# Patient Record
Sex: Female | Born: 1970 | Race: White | Hispanic: No | Marital: Married | Smoking: Never smoker
Health system: Southern US, Community
[De-identification: ages and names within clinical notes are randomized; demographics above are authoritative.]

## PROBLEM LIST (undated history)

## (undated) DIAGNOSIS — K5792 Diverticulitis of intestine, part unspecified, without perforation or abscess without bleeding: Secondary | ICD-10-CM

## (undated) DIAGNOSIS — F419 Anxiety disorder, unspecified: Secondary | ICD-10-CM

## (undated) DIAGNOSIS — F909 Attention-deficit hyperactivity disorder, unspecified type: Secondary | ICD-10-CM

## (undated) DIAGNOSIS — F32A Depression, unspecified: Secondary | ICD-10-CM

## (undated) HISTORY — DX: Depression, unspecified: F32.A

## (undated) HISTORY — DX: Attention-deficit hyperactivity disorder, unspecified type: F90.9

## (undated) HISTORY — PX: ABDOMINAL HYSTERECTOMY: SHX81

## (undated) HISTORY — DX: Diverticulitis of intestine, part unspecified, without perforation or abscess without bleeding: K57.92

## (undated) HISTORY — PX: APPENDECTOMY: SHX54

---

## 1998-03-02 ENCOUNTER — Other Ambulatory Visit: Admission: RE | Admit: 1998-03-02 | Discharge: 1998-03-02 | Payer: Self-pay | Admitting: Obstetrics and Gynecology

## 1998-12-21 ENCOUNTER — Ambulatory Visit (HOSPITAL_COMMUNITY): Admission: RE | Admit: 1998-12-21 | Discharge: 1998-12-21 | Payer: Self-pay | Admitting: Obstetrics and Gynecology

## 1999-02-17 ENCOUNTER — Inpatient Hospital Stay (HOSPITAL_COMMUNITY): Admission: RE | Admit: 1999-02-17 | Discharge: 1999-02-19 | Payer: Self-pay | Admitting: Obstetrics and Gynecology

## 2000-12-11 ENCOUNTER — Other Ambulatory Visit: Admission: RE | Admit: 2000-12-11 | Discharge: 2000-12-11 | Payer: Self-pay | Admitting: Obstetrics and Gynecology

## 2002-07-09 ENCOUNTER — Other Ambulatory Visit: Admission: RE | Admit: 2002-07-09 | Discharge: 2002-07-09 | Payer: Self-pay | Admitting: Obstetrics and Gynecology

## 2021-01-09 ENCOUNTER — Emergency Department (HOSPITAL_COMMUNITY): Payer: Managed Care, Other (non HMO)

## 2021-01-09 ENCOUNTER — Observation Stay (HOSPITAL_COMMUNITY)
Admission: EM | Admit: 2021-01-09 | Discharge: 2021-01-10 | Disposition: A | Discharge status: Home / Self Care | Payer: Managed Care, Other (non HMO) | Attending: Internal Medicine | Admitting: Internal Medicine

## 2021-01-09 ENCOUNTER — Other Ambulatory Visit: Payer: Self-pay

## 2021-01-09 ENCOUNTER — Encounter (HOSPITAL_COMMUNITY): Payer: Self-pay

## 2021-01-09 DIAGNOSIS — F419 Anxiety disorder, unspecified: Secondary | ICD-10-CM | POA: Insufficient documentation

## 2021-01-09 DIAGNOSIS — F17200 Nicotine dependence, unspecified, uncomplicated: Secondary | ICD-10-CM | POA: Insufficient documentation

## 2021-01-09 DIAGNOSIS — G629 Polyneuropathy, unspecified: Principal | ICD-10-CM | POA: Insufficient documentation

## 2021-01-09 DIAGNOSIS — Z20822 Contact with and (suspected) exposure to covid-19: Secondary | ICD-10-CM | POA: Diagnosis not present

## 2021-01-09 DIAGNOSIS — R937 Abnormal findings on diagnostic imaging of other parts of musculoskeletal system: Secondary | ICD-10-CM

## 2021-01-09 DIAGNOSIS — Z79899 Other long term (current) drug therapy: Secondary | ICD-10-CM | POA: Insufficient documentation

## 2021-01-09 HISTORY — DX: Anxiety disorder, unspecified: F41.9

## 2021-01-09 LAB — BASIC METABOLIC PANEL
Anion gap: 10 (ref 5–15)
BUN: 11 mg/dL (ref 6–20)
CO2: 25 mmol/L (ref 22–32)
Calcium: 9.9 mg/dL (ref 8.9–10.3)
Chloride: 102 mmol/L (ref 98–111)
Creatinine, Ser: 0.71 mg/dL (ref 0.44–1.00)
GFR, Estimated: >60 mL/min (ref >60)
Glucose, Bld: 104 mg/dL — ABNORMAL HIGH (ref 70–99)
Potassium: 4 mmol/L (ref 3.5–5.1)
Sodium: 137 mmol/L (ref 135–145)

## 2021-01-09 LAB — URINALYSIS, ROUTINE W REFLEX MICROSCOPIC
Bilirubin Urine: NEGATIVE
Glucose, UA: NEGATIVE mg/dL
Hgb urine dipstick: NEGATIVE
Ketones, ur: NEGATIVE mg/dL
Leukocytes,Ua: NEGATIVE
Nitrite: NEGATIVE
Protein, ur: NEGATIVE mg/dL
Specific Gravity, Urine: 1.004 — ABNORMAL LOW (ref 1.005–1.030)
pH: 6 (ref 5.0–8.0)

## 2021-01-09 LAB — CSF CELL COUNT WITH DIFFERENTIAL
RBC Count, CSF: 0 /mm3
RBC Count, CSF: 0 /mm3
Tube #: 1
Tube #: 4
WBC, CSF: 1 /mm3 (ref 0–5)
WBC, CSF: 1 /mm3 (ref 0–5)

## 2021-01-09 LAB — CBC
HCT: 47.8 % — ABNORMAL HIGH (ref 36.0–46.0)
Hemoglobin: 15.8 g/dL — ABNORMAL HIGH (ref 12.0–15.0)
MCH: 31.5 pg (ref 26.0–34.0)
MCHC: 33.1 g/dL (ref 30.0–36.0)
MCV: 95.2 fL (ref 80.0–100.0)
Platelets: 312 10*3/uL (ref 150–400)
RBC: 5.02 MIL/uL (ref 3.87–5.11)
RDW: 12.2 % (ref 11.5–15.5)
WBC: 7.1 10*3/uL (ref 4.0–10.5)
nRBC: 0 % (ref 0.0–0.2)

## 2021-01-09 LAB — PROTEIN AND GLUCOSE, CSF
Glucose, CSF: 65 mg/dL (ref 40–70)
Total  Protein, CSF: 21 mg/dL (ref 15–45)

## 2021-01-09 LAB — TSH: TSH: 0.735 u[IU]/mL (ref 0.350–4.500)

## 2021-01-09 IMAGING — MR MR CERVICAL SPINE WO/W CM
7 of 8 series · 34 of 48 positions shown · IV contrast (5ml GADAVIST)
Comparison: None.

CLINICAL DATA: Numbness or tingling; technologist note states
bilateral foot and leg numbness

EXAM:
MRI CERVICAL AND THORACIC SPINE WITHOUT AND WITH CONTRAST
TECHNIQUE: Multiplanar and multiecho pulse sequences of the cervical spine, to
include the craniocervical junction and cervicothoracic junction,
and the thoracic spine, were obtained without and with intravenous
contrast.
CONTRAST:  5mL GADAVIST GADOBUTROL 1 MMOL/ML IV SOLN

[Series 5: T1 · sagittal · 3.0mm · 0.69mm/px · 4 of 15 slices shown (1 of 2)]
[im 1/15]
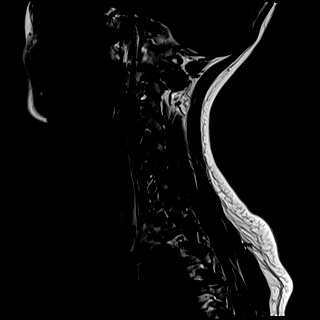
[im 5/15]
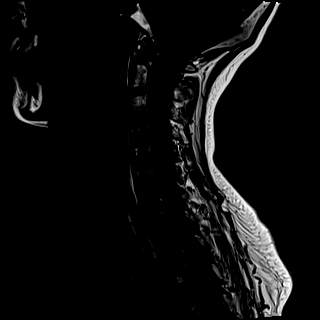
[im 10/15]
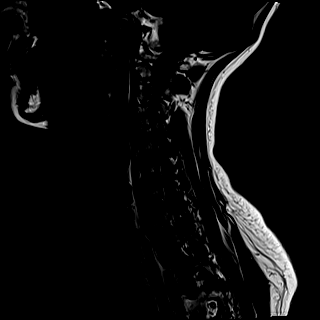
[im 15/15]
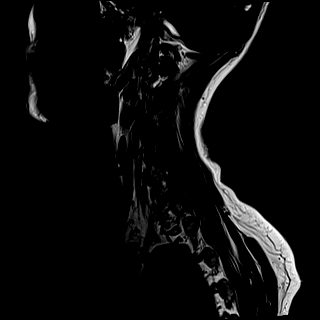

[Series 6: STIR · sagittal · 3.0mm · 0.86mm/px · 4 of 15 slices shown]
[im 1/15]
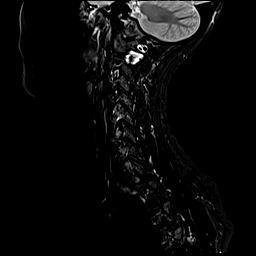
[im 5/15]
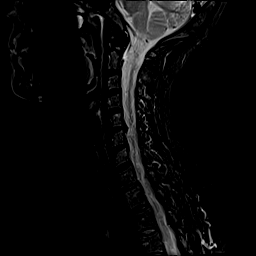
[im 10/15]
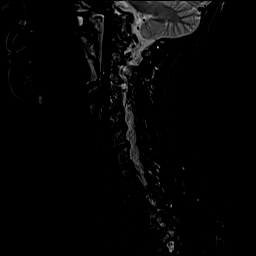
[im 15/15]
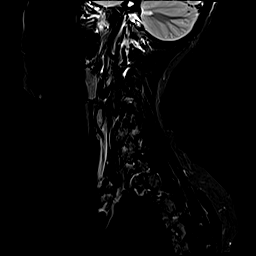

[Series 7: T2 · axial · 3.0mm · 0.78mm/px · z∈[-40,+60]mm · 8 of 30 slices shown]
[im 1/30]
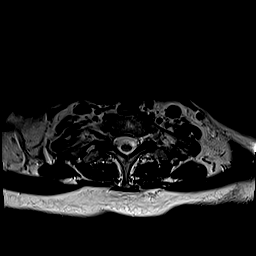
[im 5/30]
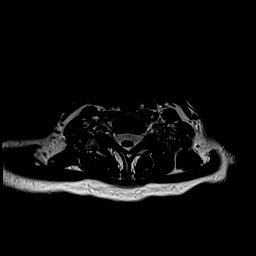
[im 9/30]
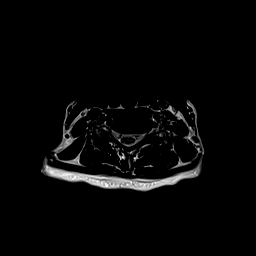
[im 13/30]
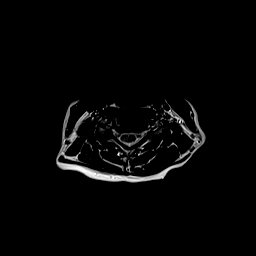
[im 17/30]
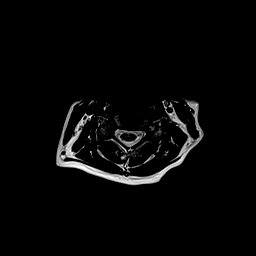
[im 21/30]
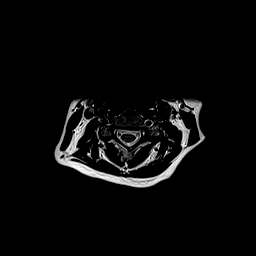
[im 25/30]
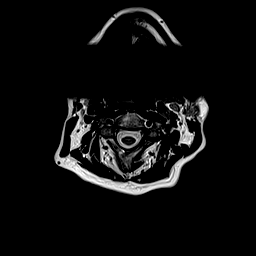
[im 30/30]
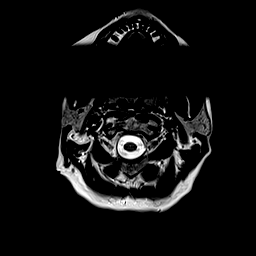

[Series 9: T1 · axial · 3.0mm · 0.35mm/px · z∈[-38,+63]mm · 8 of 30 slices shown (2 of 2)]
[im 1/30]
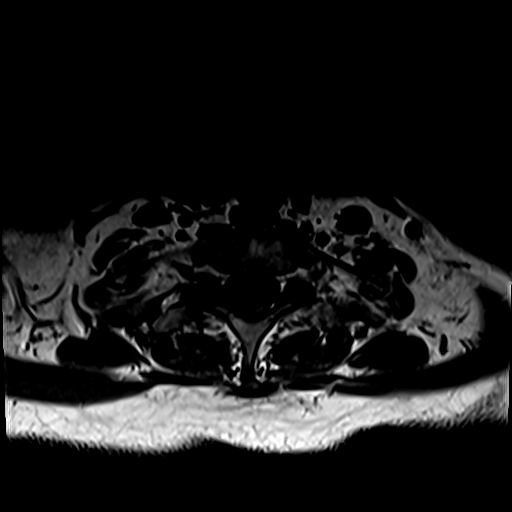
[im 5/30]
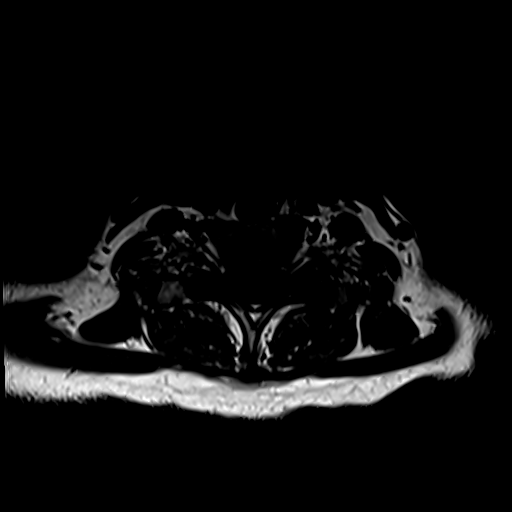
[im 9/30]
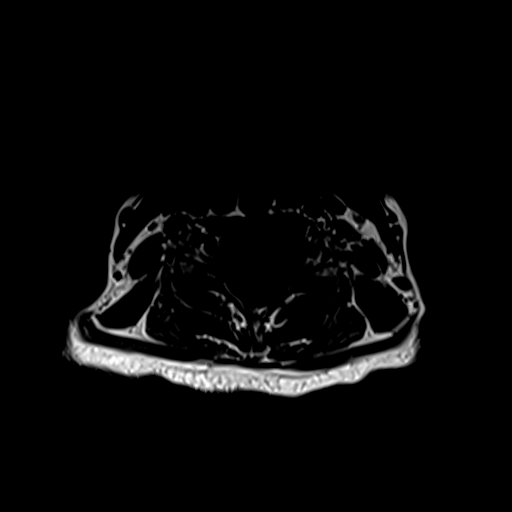
[im 13/30]
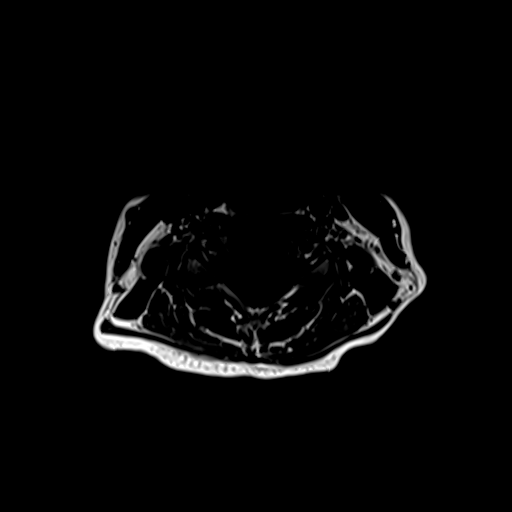
[im 17/30]
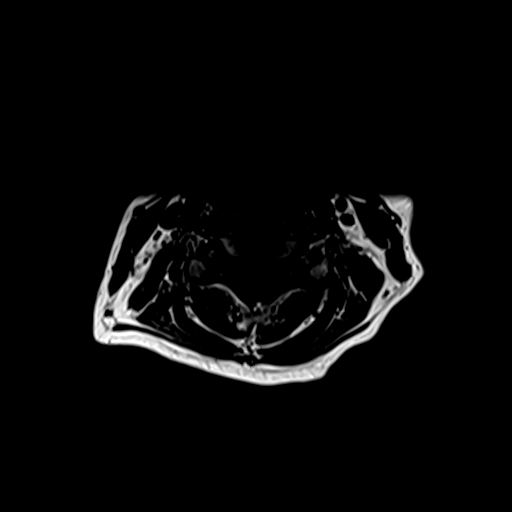
[im 21/30]
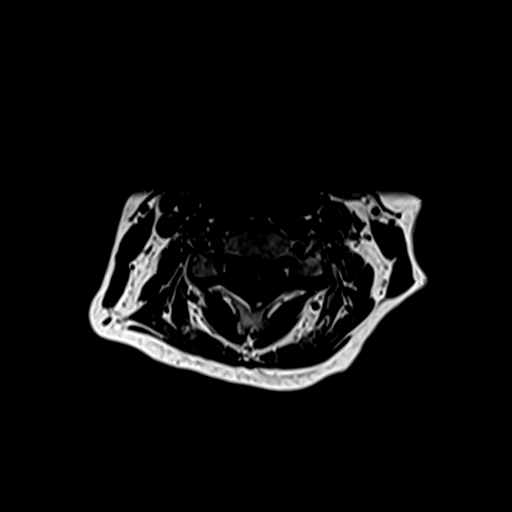
[im 25/30]
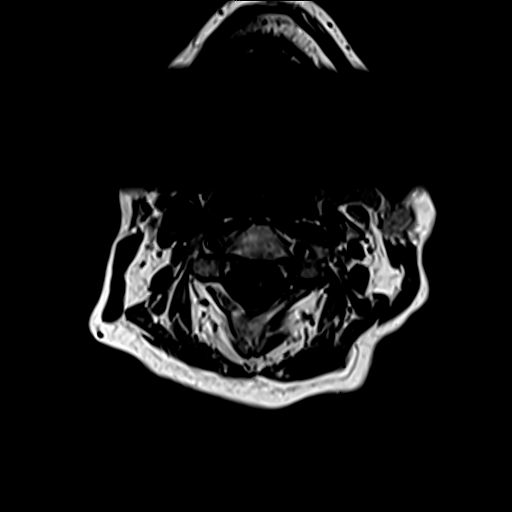
[im 30/30]
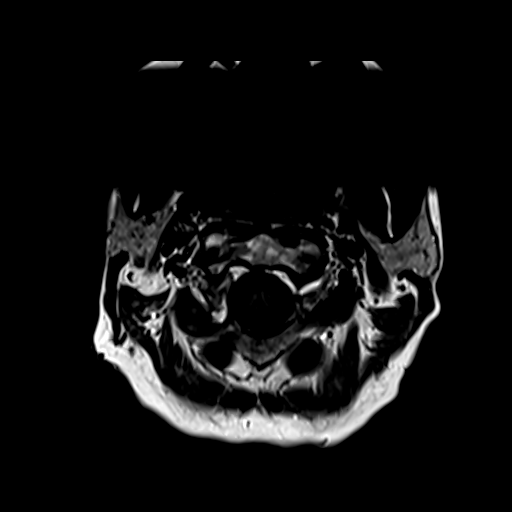

[Series 10: T2 post-contrast · sagittal · 3.0mm · 0.69mm/px · 4 of 15 slices shown]
[im 1/15]
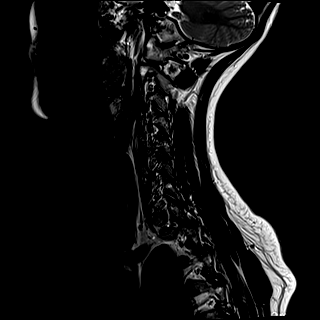
[im 5/15]
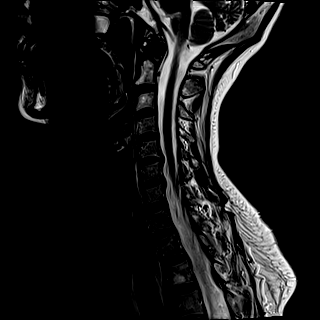
[im 10/15]
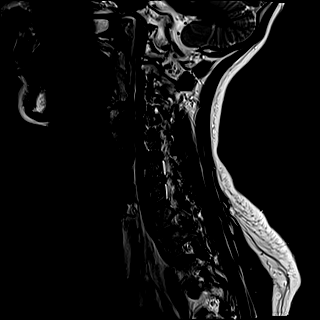
[im 15/15]
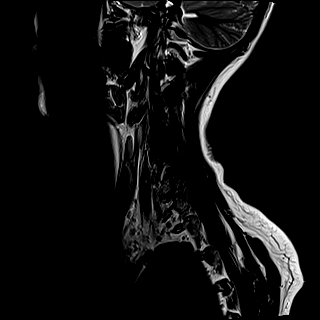

[Series 11: T1 fat-sat post-contrast · sagittal · 3.0mm · 0.69mm/px · 4 of 15 slices shown]
[im 1/15]
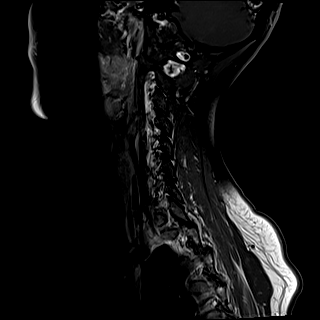
[im 5/15]
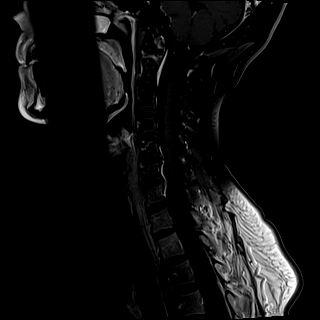
[im 10/15]
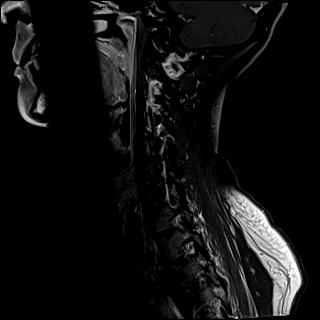
[im 15/15]
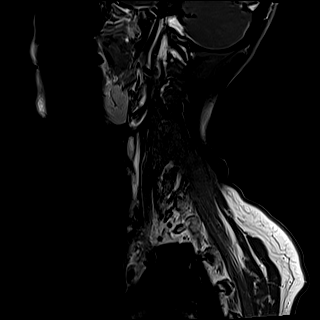

[Series 12: T1 post-contrast · axial · 3.0mm · 0.35mm/px · z∈[-38,-24]mm · 2 of 30 slices shown]
[im 1/30]
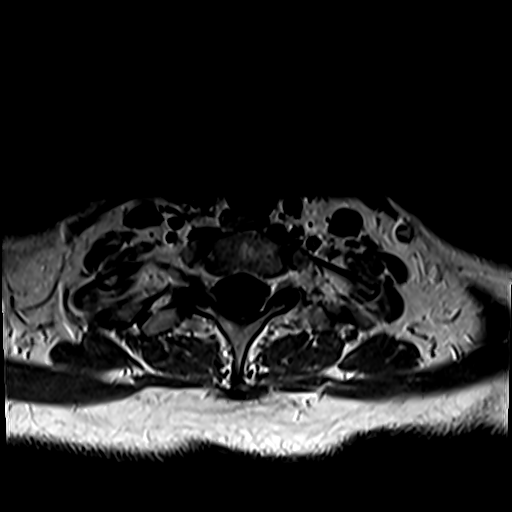
[im 5/30]
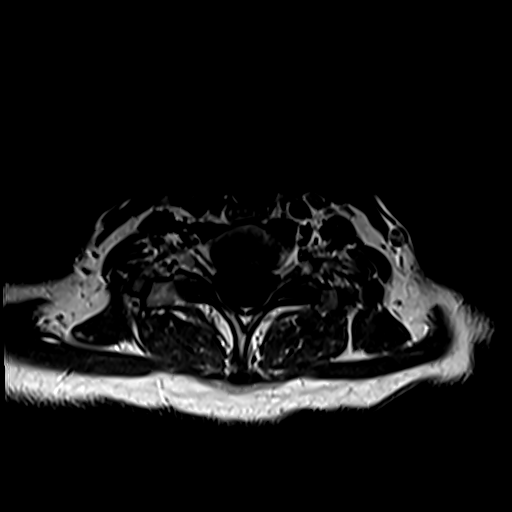

[34 of 48 positions shown; findings below may reference images not displayed]

FINDINGS: MRI CERVICAL SPINE

Alignment: Trace retrolisthesis at C5-C6.

Vertebrae: Vertebral body heights are maintained.  No marrow edema.

Cord: No abnormal signal or enhancement.

Posterior Fossa, vertebral arteries, paraspinal tissues:
Unremarkable.

Disc levels: Intervertebral disc heights and signal are maintained.
Mild multilevel facet hypertrophy. Disc bulges at C5-C6 and C6-C7.
No significant canal or foraminal stenosis.

MRI THORACIC SPINE

Alignment:  Preserved

Vertebrae: Vertebral body heights are maintained. Probable vertebral
body hemangioma at T11. No substantial marrow edema.

Cord:  Abnormal signal centrally at the T6 level.  No enhancement.

Paraspinal and other soft tissues: Unremarkable.

Disc levels: Intervertebral disc heights and signal are maintained.
There is no disc herniation or canal or foraminal stenosis at any
level.
IMPRESSION: Abnormal cord signal without enhancement at the T6 level. May
reflect chronic demyelination.

## 2021-01-09 IMAGING — MR MR THORACIC SPINE WO/W CM
8 of 9 series · 33 of 48 positions shown · IV contrast (5ml GADAVIST)
Comparison: None.

CLINICAL DATA: Numbness or tingling; technologist note states
bilateral foot and leg numbness

EXAM:
MRI CERVICAL AND THORACIC SPINE WITHOUT AND WITH CONTRAST
TECHNIQUE: Multiplanar and multiecho pulse sequences of the cervical spine, to
include the craniocervical junction and cervicothoracic junction,
and the thoracic spine, were obtained without and with intravenous
contrast.
CONTRAST:  5mL GADAVIST GADOBUTROL 1 MMOL/ML IV SOLN

[Series 16: T1 · sagittal · 4.0mm · 1.72mm/px · 1 of 5 slices shown (1 of 3)]
[im 1/5]
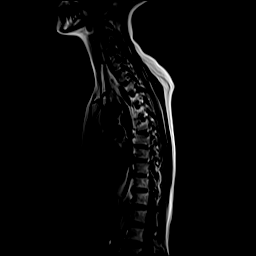

[Series 17: STIR · sagittal · 3.0mm · 1.06mm/px · 3 of 17 slices shown]
[im 1/17]
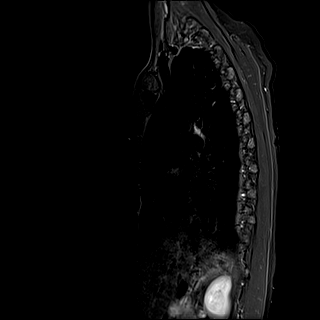
[im 9/17]
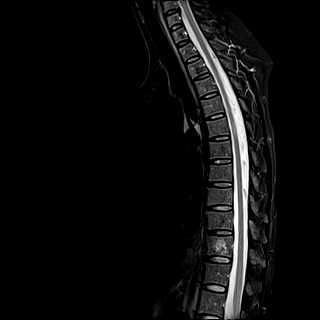
[im 17/17]
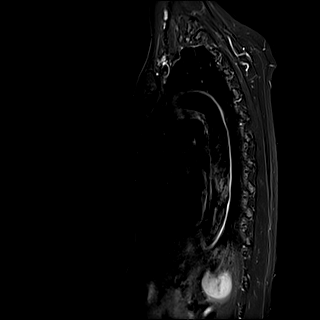

[Series 18: T1 · sagittal · 3.0mm · 1.00mm/px · 4 of 17 slices shown (2 of 3)]
[im 1/17]
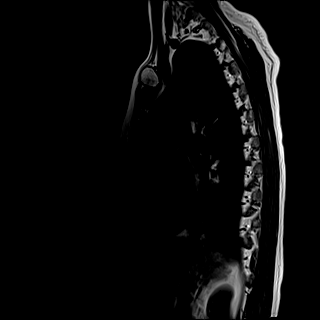
[im 6/17]
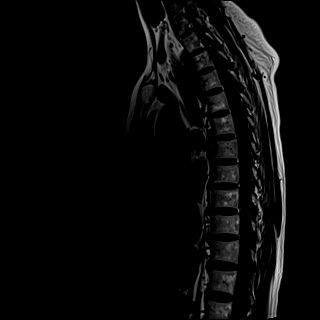
[im 11/17]
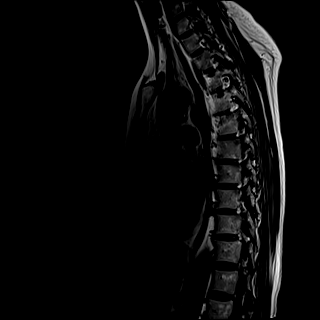
[im 17/17]
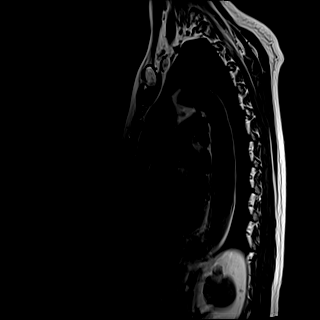

[Series 19: T2 · axial · 4.0mm · 0.78mm/px · z∈[-279,-40]mm · 8 of 39 slices shown]
[im 1/39]
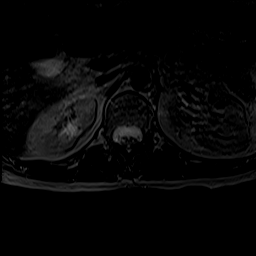
[im 6/39]
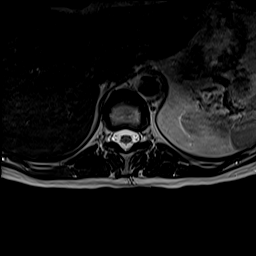
[im 11/39]
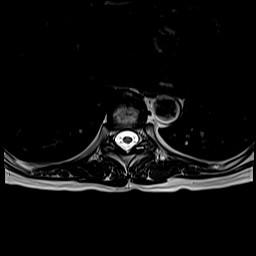
[im 17/39]
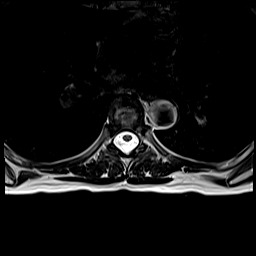
[im 22/39]
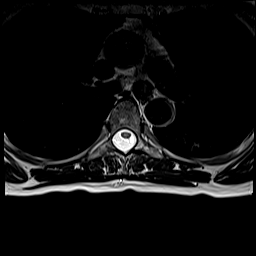
[im 28/39]
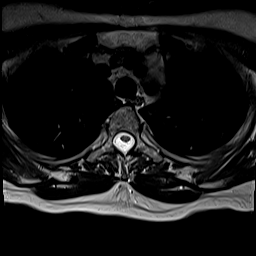
[im 33/39]
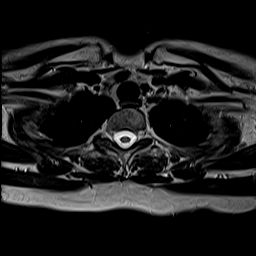
[im 39/39]
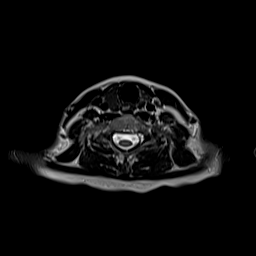

[Series 21: T1 · axial · 4.0mm · 0.39mm/px · z∈[-279,-40]mm · 8 of 39 slices shown (3 of 3)]
[im 1/39]
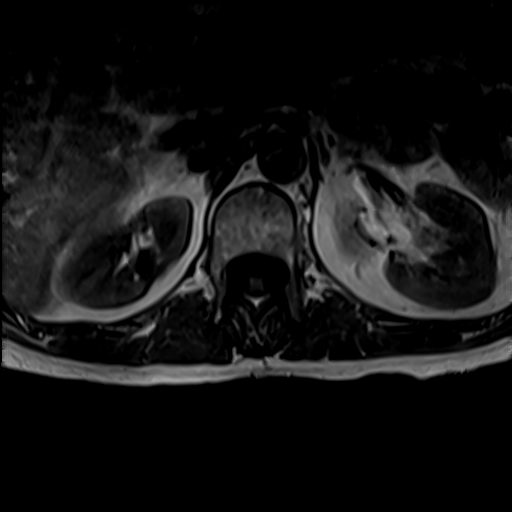
[im 6/39]
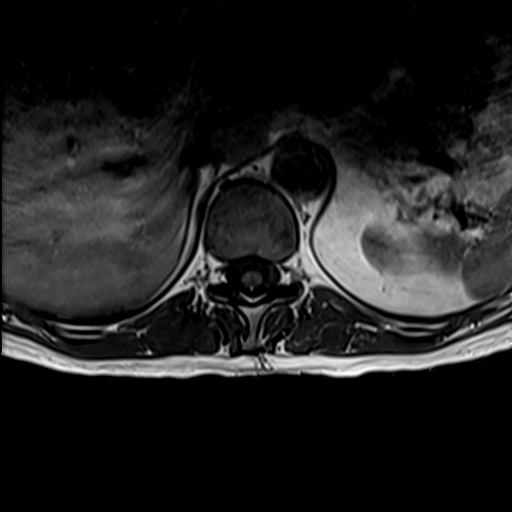
[im 11/39]
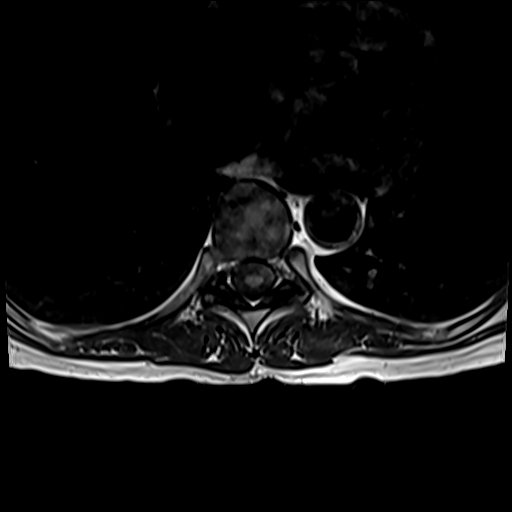
[im 17/39]
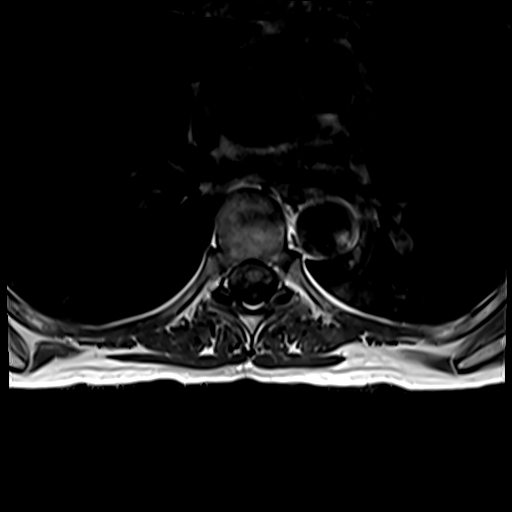
[im 22/39]
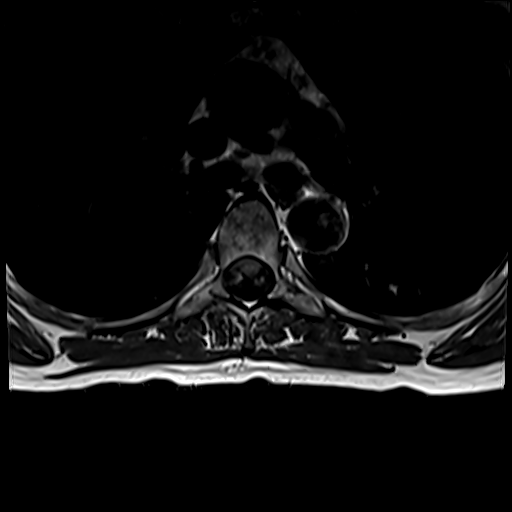
[im 28/39]
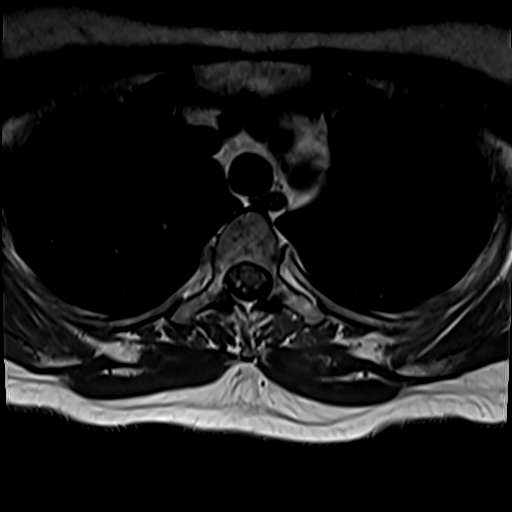
[im 33/39]
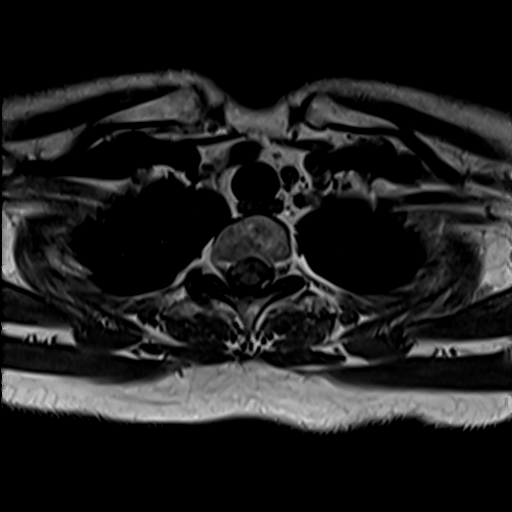
[im 39/39]
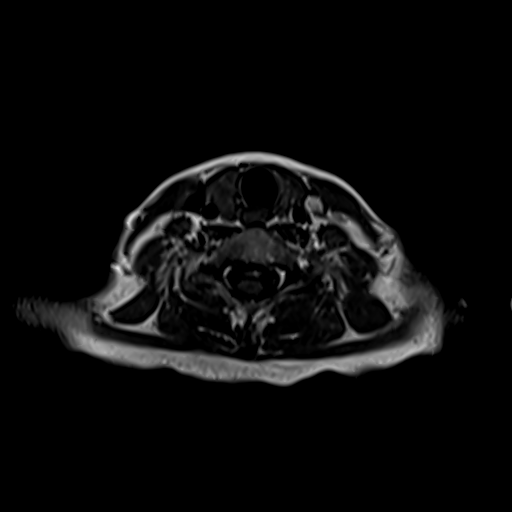

[Series 22: T2 post-contrast · sagittal · 3.0mm · 0.83mm/px · 4 of 17 slices shown]
[im 1/17]
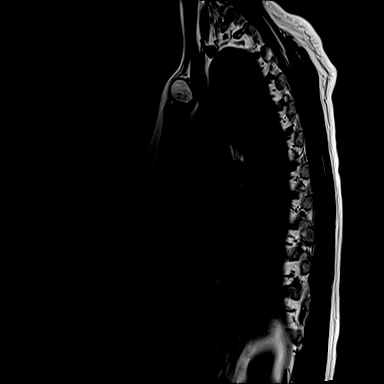
[im 6/17]
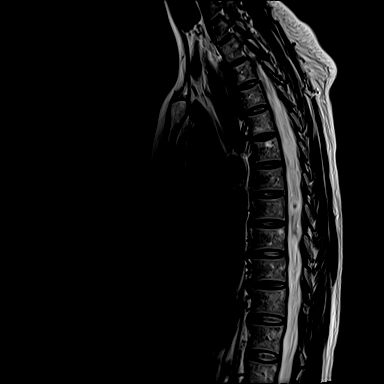
[im 11/17]
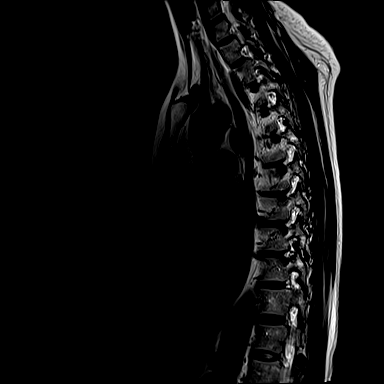
[im 17/17]
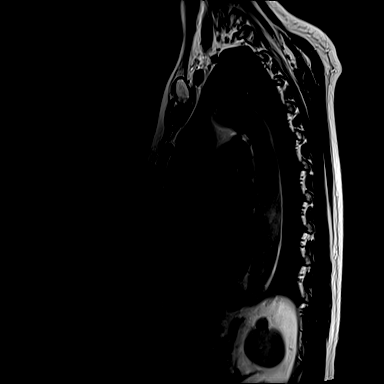

[Series 23: T1 fat-sat post-contrast · sagittal · 3.0mm · 1.00mm/px · 4 of 17 slices shown]
[im 1/17]
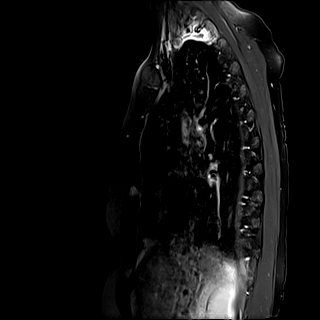
[im 6/17]
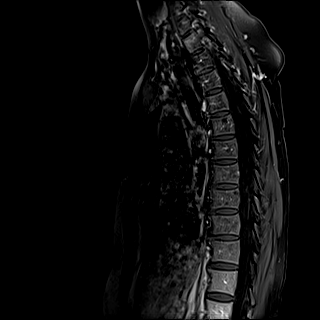
[im 11/17]
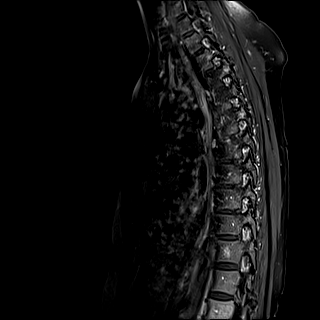
[im 17/17]
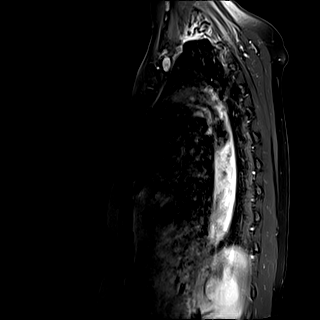

[Series 24: T1 post-contrast · axial · 4.0mm · 0.41mm/px · 1 of 39 slices shown]
[im 1/39]
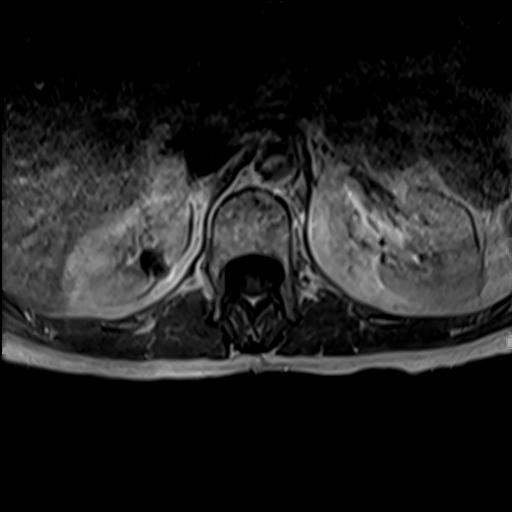

[33 of 48 positions shown; findings below may reference images not displayed]

FINDINGS: MRI CERVICAL SPINE

Alignment: Trace retrolisthesis at C5-C6.

Vertebrae: Vertebral body heights are maintained.  No marrow edema.

Cord: No abnormal signal or enhancement.

Posterior Fossa, vertebral arteries, paraspinal tissues:
Unremarkable.

Disc levels: Intervertebral disc heights and signal are maintained.
Mild multilevel facet hypertrophy. Disc bulges at C5-C6 and C6-C7.
No significant canal or foraminal stenosis.

MRI THORACIC SPINE

Alignment:  Preserved

Vertebrae: Vertebral body heights are maintained. Probable vertebral
body hemangioma at T11. No substantial marrow edema.

Cord:  Abnormal signal centrally at the T6 level.  No enhancement.

Paraspinal and other soft tissues: Unremarkable.

Disc levels: Intervertebral disc heights and signal are maintained.
There is no disc herniation or canal or foraminal stenosis at any
level.
IMPRESSION: Abnormal cord signal without enhancement at the T6 level. May
reflect chronic demyelination.

## 2021-01-09 MED ORDER — ACETAMINOPHEN 325 MG PO TABS: 650.0000 mg | ORAL_TABLET | Freq: Four times a day (QID) | ORAL | Status: DC | PRN

## 2021-01-09 MED ORDER — ACETAMINOPHEN 325 MG PO TABS
650.0000 mg | ORAL_TABLET | Freq: Four times a day (QID) | ORAL | Status: DC | PRN
  Administered 2021-01-10 (×2): 650 mg via ORAL
  Filled 2021-01-09 (×2): qty 2

## 2021-01-09 MED ORDER — GADOBUTROL 1 MMOL/ML IV SOLN
5.0000 mL | Freq: Once | INTRAVENOUS | Status: AC | PRN
  Administered 2021-01-09: 5 mL via INTRAVENOUS

## 2021-01-09 MED ORDER — ACETAMINOPHEN 650 MG RE SUPP: 650.0000 mg | Freq: Four times a day (QID) | RECTAL | Status: DC | PRN

## 2021-01-09 MED ORDER — ZOLPIDEM TARTRATE 5 MG PO TABS
5.0000 mg | ORAL_TABLET | Freq: Once | ORAL | Status: AC
  Administered 2021-01-09: 5 mg via ORAL
  Filled 2021-01-09: qty 1

## 2021-01-09 MED ORDER — NICOTINE 14 MG/24HR TD PT24
14.0000 mg | MEDICATED_PATCH | Freq: Every day | TRANSDERMAL | Status: DC
  Filled 2021-01-09: qty 1

## 2021-01-09 MED ORDER — SODIUM CHLORIDE 0.9 % IV SOLN: INTRAVENOUS | Status: DC

## 2021-01-09 MED ORDER — ALPRAZOLAM 0.5 MG PO TABS
1.0000 mg | ORAL_TABLET | Freq: Two times a day (BID) | ORAL | Status: DC | PRN
  Administered 2021-01-10: 1 mg via ORAL
  Filled 2021-01-09: qty 2

## 2021-01-09 MED ORDER — ADULT MULTIVITAMIN W/MINERALS CH
1.0000 | ORAL_TABLET | Freq: Every day | ORAL | Status: DC
  Administered 2021-01-10: 1 via ORAL
  Filled 2021-01-09: qty 1

## 2021-01-09 NOTE — ED Provider Notes (Addendum)
Physical Exam  BP 125/84   Pulse 66   Temp 98.1 F (36.7 C)   Resp 19   Ht 5\' 6"  (1.676 m)   Wt 58.1 kg   SpO2 96%   BMI 20.66 kg/m   Physical Exam  ED Course/Procedures   Clinical Course as of 01/09/21 2109  Sat Jan 09, 2021  1457 Urine is negative.  [CS]  1505 Care of the patient signed out to Dr. Jan 11, 2021 at the change of shift pending MRI.  [CS]    Clinical Course User Index [CS] Rhunette Croft, MD    .Lumbar Puncture  Date/Time: 01/09/2021 9:08 PM Performed by: 01/11/2021, MD Authorized by: Derwood Kaplan, MD   Consent:    Consent obtained:  Written   Consent given by:  Patient   Risks, benefits, and alternatives were discussed: yes     Risks discussed:  Bleeding, infection, pain, nerve damage and headache   Alternatives discussed:  No treatment Universal protocol:    Procedure explained and questions answered to patient or proxy's satisfaction: yes     Relevant documents present and verified: yes     Test results available: yes     Imaging studies available: yes     Required blood products, implants, devices, and special equipment available: yes     Immediately prior to procedure a time out was called: yes     Site/side marked: yes     Patient identity confirmed:  Arm band Pre-procedure details:    Procedure purpose:  Diagnostic   Preparation: Patient was prepped and draped in usual sterile fashion   Anesthesia:    Anesthesia method:  Local infiltration   Local anesthetic:  Lidocaine 1% w/o epi Procedure details:    Lumbar space:  L3-L4 interspace   Patient position:  Sitting   Needle gauge:  18   Needle type:  Diamond point   Ultrasound guidance: no     Number of attempts:  1   Fluid appearance:  Clear   Tubes of fluid:  4   Total volume (ml):  15 Post-procedure details:    Puncture site:  Adhesive bandage applied and direct pressure applied   Procedure completion:  Tolerated well, no immediate complications .Critical Care Performed  by: Derwood Kaplan, MD Authorized by: Derwood Kaplan, MD   Critical care provider statement:    Critical care time (minutes):  32   Critical care was necessary to treat or prevent imminent or life-threatening deterioration of the following conditions:  CNS failure or compromise   Critical care was time spent personally by me on the following activities:  Discussions with consultants, evaluation of patient's response to treatment, examination of patient, ordering and performing treatments and interventions, ordering and review of laboratory studies, ordering and review of radiographic studies, pulse oximetry, re-evaluation of patient's condition, obtaining history from patient or surrogate and review of old charts    MDM  Assuming care of patient from Dr. Derwood Kaplan.   Patient in the ED for paresthesias in bilateral lower extremity for the last 3 weeks/month. Workup thus far shows reassuring looking labs.  Concerning findings are as following : None Important pending results are MRI of the lumbar and thoracic spine with and without contrast.  According to Dr. Bernette Mayers, plan is to review the MRI.  If there is any evidence of demyelination, consult neurology.  Patient had no complains, no concerns from the nursing side. Will continue to monitor.   7:10 PM  Discussed case with Dr.  Lindzen.  He has recommended that we get MRI without contrast of the brain and lumbar spine. He will request Dr. Wilford Corner to follow up. Patient made aware.   9:10 PM Dr. Wilford Corner requested LP. It has been completed. Pt will be [facility] to [facility] as the MRI will not be completed until tomorrow.   Derwood Kaplan, MD 01/09/21 Ronna Polio, MD 01/09/21 2111

## 2021-01-09 NOTE — H&P (Signed)
History and Physical    Jennifer Bullock ZGY:[id number] DOB: 15-Jul-1971 DOA: 01/09/2021  PCP: Leanora Ivanoff., MD   Patient coming from:  Home  Chief Complaint: Numbness in feet and legs  HPI: Jennifer Bullock is a 50 y.o. female with medical history of anxiety and perimenopausal who presents for evaluation of decreased sensation in feet and legs. Jennifer Bullock reports she began having tingling in her toes about a month ago. She reports that since then the tingling has been progressively getting worse and has crept up her legs. This morning it was all the way up to her knees. She denies any weakness. No fever or back pain. No tingling in fingers. No facial droop, slurred speech or difficulty walking. The tingling and numbness is in both feet and legs. She was in [place] earlier this month and had a course of doxycycline while she was there 'for her stomach' which she attributed to food poisoning while she was there. The symptoms started prior to her going on her trip. She eats a well balanced diet, not vegan or vegetarian. She has tried Yoga without any change in her symptoms. She had her Covid vaccines in March/April 2021 with booster in December 2021. No sick contacts. No new pets or plants in home. She does smoke cigarettes but has been cutting back and using vape products.  She does report she has tried marijuana for symptoms a few weeks ago.  She has not used any marijuana in the last 7 to 10 days.  ED Course: Patient had CT of her neck and thoracic spine.  She had a nonenhancing lesion at the T6 level suspicious for a chronic demyelination.  Patient is hemodynamically stable and lab work is otherwise unremarkable.  Review of Systems:  General: Denies fever, chills, weight loss, night sweats.  Denies dizziness.  Denies change in appetite HENT: Denies head trauma, headache, denies change in hearing, tinnitus.  Denies nasal congestion or bleeding.  Denies sore throat, sores in mouth.  Denies  difficulty swallowing Eyes: Denies blurry vision, pain in eye, drainage.  Denies discoloration of eyes. Neck: Denies pain.  Denies swelling.  Denies pain with movement. Cardiovascular: Denies chest pain, palpitations.  Denies edema.  Denies orthopnea Respiratory: Denies shortness of breath, cough.  Denies wheezing.  Denies sputum production Gastrointestinal: Denies abdominal pain, swelling.  Denies nausea, vomiting, diarrhea.  Denies melena.  Denies hematemesis. Musculoskeletal: Denies limitation of movement.  Denies deformity or swelling.  Denies pain.  Denies arthralgias or myalgias. Genitourinary: Denies pelvic pain.  Denies urinary frequency or hesitancy.  Denies dysuria.  Skin: Denies rash.  Denies petechiae, purpura, ecchymosis. Neurological: Reports paresthesia of feet and legs. Denies headache.  Denies syncope.  Denies seizure activity.  Denies weakness. Denies slurred speech, drooping face.  Denies visual change. Psychiatric: Denies depression, anxiety.  Denies suicidal thoughts or ideation.  Denies hallucinations.  Past Medical History:  Diagnosis Date  . Anxiety     Past Surgical History:  Procedure Laterality Date  . ABDOMINAL HYSTERECTOMY    . APPENDECTOMY      Social History  reports that she has never smoked. She has never used smokeless tobacco. No history on file for alcohol use and drug use.  Allergies  Allergen Reactions  . Nsaids Nausea And Vomiting    History reviewed. No pertinent family history.   Prior to Admission medications   Medication Sig Start Date End Date Taking? Authorizing Provider  acetaminophen (TYLENOL) 325 MG tablet Take 650 mg by  mouth every 6 (six) hours as needed for mild pain, fever or headache.   Yes [provider]  ALPRAZolam Prudy Feeler) 1 MG tablet Take 1 mg by mouth 2 (two) times daily as needed for anxiety. 12/30/20  Yes [provider]  amphetamine-dextroamphetamine (ADDERALL) 20 MG tablet Take 20 mg by mouth 2 (two)  times daily. 12/29/20  Yes [provider]  estradiol (ESTRACE) 1 MG tablet Take 0.5-1 mg by mouth daily. 11/10/20  Yes [provider]  Multiple Vitamin (MULTIVITAMIN WITH MINERALS) TABS tablet Take 1 tablet by mouth daily.   Yes [provider]    Physical Exam: Vitals:   01/09/21 1646 01/09/21 1730 01/09/21 1830 01/09/21 1958  BP: 103/77 106/83 114/82 125/84  Pulse: 70 100 77 66  Resp: 18 18 18 19   Temp:      SpO2: 96% 99% 93% 96%  Weight:      Height:        Constitutional: NAD, calm, comfortable Vitals:   01/09/21 1646 01/09/21 1730 01/09/21 1830 01/09/21 1958  BP: 103/77 106/83 114/82 125/84  Pulse: 70 100 77 66  Resp: 18 18 18 19   Temp:      SpO2: 96% 99% 93% 96%  Weight:      Height:       General: WDWN, Alert and oriented x3.  Eyes: EOMI, PERRL, conjunctivae normal.  Sclera nonicteric HENT:  [place]/AT, external ears normal.  Nares patent without epistasis.  Mucous membranes are moist. Posterior pharynx clear of any exudate or lesions. Normal dentition.  Neck: Soft, normal range of motion, supple, no masses, no thyromegaly. Trachea midline Respiratory: clear to auscultation bilaterally, no wheezing, no crackles. Normal respiratory effort. No accessory muscle use.  Cardiovascular: Regular rate and rhythm, no murmurs / rubs / gallops. No extremity edema. 2+ pedal pulses.  Abdomen: Soft, no tenderness, nondistended, no rebound or guarding.  No masses palpated. No hepatosplenomegaly. Bowel sounds normoactive Musculoskeletal: FROM. no cyanosis. No joint deformity upper and lower extremities. no contractures. Normal muscle tone.  Skin: Warm, dry, intact no rashes, lesions, ulcers. No induration Neurologic: CN 2-12 grossly intact. Normal speech. Sensation intact in trunk and upper extremities. Decreased sensation to touch in bilateral legs from feet to knee, patella DTR +2 bilaterally. Strength 5/5 in all extremities.   Psychiatric: Normal judgment and  insight.  Normal mood.    Labs on Admission: I have personally reviewed following labs and imaging studies  CBC: Recent Labs  Lab 01/09/21 1137  WBC 7.1  HGB 15.8*  HCT 47.8*  MCV 95.2  PLT 312    Basic Metabolic Panel: Recent Labs  Lab 01/09/21 1137  NA 137  K 4.0  CL 102  CO2 25  GLUCOSE 104*  BUN 11  CREATININE 0.71  CALCIUM 9.9    GFR: Estimated Creatinine Clearance: 78.9 mL/min (by C-G formula based on SCr of 0.71 mg/dL).  Liver Function Tests: No results for input(s): AST, ALT, ALKPHOS, BILITOT, PROT, ALBUMIN in the last 168 hours.  Urine analysis:    Component Value Date/Time   COLORURINE YELLOW 01/09/2021 1422   APPEARANCEUR CLEAR 01/09/2021 1422   LABSPEC 1.004 (L) 01/09/2021 1422   PHURINE 6.0 01/09/2021 1422   GLUCOSEU NEGATIVE 01/09/2021 1422   HGBUR NEGATIVE 01/09/2021 1422   BILIRUBINUR NEGATIVE 01/09/2021 1422   KETONESUR NEGATIVE 01/09/2021 1422   PROTEINUR NEGATIVE 01/09/2021 1422   NITRITE NEGATIVE 01/09/2021 1422   LEUKOCYTESUR NEGATIVE 01/09/2021 1422    Radiological Exams on Admission: MR Cervical  Spine W or Wo Contrast  Result Date: 01/09/2021 CLINICAL DATA:  Numbness or tingling; technologist note states bilateral foot and leg numbness EXAM: MRI CERVICAL AND THORACIC SPINE WITHOUT AND WITH CONTRAST TECHNIQUE: Multiplanar and multiecho pulse sequences of the cervical spine, to include the craniocervical junction and cervicothoracic junction, and the thoracic spine, were obtained without and with intravenous contrast. CONTRAST:  5mL GADAVIST GADOBUTROL 1 MMOL/ML IV SOLN COMPARISON:  None. FINDINGS: MRI CERVICAL SPINE Alignment: Trace retrolisthesis at C5-C6. Vertebrae: Vertebral body heights are maintained.  No marrow edema. Cord: No abnormal signal or enhancement. Posterior Fossa, vertebral arteries, paraspinal tissues: Unremarkable. Disc levels: Intervertebral disc heights and signal are maintained. Mild multilevel facet hypertrophy. Disc  bulges at C5-C6 and C6-C7. No significant canal or foraminal stenosis. MRI THORACIC SPINE Alignment:  Preserved Vertebrae: Vertebral body heights are maintained. Probable vertebral body hemangioma at T11. No substantial marrow edema. Cord:  Abnormal signal centrally at the T6 level.  No enhancement. Paraspinal and other soft tissues: Unremarkable. Disc levels: Intervertebral disc heights and signal are maintained. There is no disc herniation or canal or foraminal stenosis at any level. IMPRESSION: Abnormal cord signal without enhancement at the T6 level. May reflect chronic demyelination. Electronically Signed   By: Guadlupe SpanishPraneil  Patel M.D.   On: 01/09/2021 17:08   MR THORACIC SPINE W WO CONTRAST  Result Date: 01/09/2021 CLINICAL DATA:  Numbness or tingling; technologist note states bilateral foot and leg numbness EXAM: MRI CERVICAL AND THORACIC SPINE WITHOUT AND WITH CONTRAST TECHNIQUE: Multiplanar and multiecho pulse sequences of the cervical spine, to include the craniocervical junction and cervicothoracic junction, and the thoracic spine, were obtained without and with intravenous contrast. CONTRAST:  5mL GADAVIST GADOBUTROL 1 MMOL/ML IV SOLN COMPARISON:  None. FINDINGS: MRI CERVICAL SPINE Alignment: Trace retrolisthesis at C5-C6. Vertebrae: Vertebral body heights are maintained.  No marrow edema. Cord: No abnormal signal or enhancement. Posterior Fossa, vertebral arteries, paraspinal tissues: Unremarkable. Disc levels: Intervertebral disc heights and signal are maintained. Mild multilevel facet hypertrophy. Disc bulges at C5-C6 and C6-C7. No significant canal or foraminal stenosis. MRI THORACIC SPINE Alignment:  Preserved Vertebrae: Vertebral body heights are maintained. Probable vertebral body hemangioma at T11. No substantial marrow edema. Cord:  Abnormal signal centrally at the T6 level.  No enhancement. Paraspinal and other soft tissues: Unremarkable. Disc levels: Intervertebral disc heights and signal are  maintained. There is no disc herniation or canal or foraminal stenosis at any level. IMPRESSION: Abnormal cord signal without enhancement at the T6 level. May reflect chronic demyelination. Electronically Signed   By: Guadlupe SpanishPraneil  Patel M.D.   On: 01/09/2021 17:08    Assessment/Plan Principal Problem:   Neuropathy Ms. Macdowell [facility] Med[facility]Surg floor.  She has numbness in bilateral legs up to the knees.  She has a nonenhancing abnormal cord signal at T6 on CT of her  Spine chronic demyelination.  Differential diagnosis includes multiple sclerosis, viral infection, or myelitis, ischemia.  Case was discussed with neurology by the ER physician.  Neurology recommends obtaining an MRI of the brain and lumbar spine and performing a lumbar puncture.  Neurology requested patient be [facility] where they can evaluate and see patient tonight upon arrival.  Active Problems:   Abnormal computed tomography of thoracic spine See discussion above    Anxiety Chronic.  Continue home medication of Ativan    Tobacco use Nicotine patch provided to prevent withdrawal.     DVT prophylaxis: Padua score low. TED  hose and ambulation for DVT prophylaxis  Code Status:   Full Code  Family Communication:  Diagnosis and plan discussed with patient and her husband who is at the bedside.  Questions were answered.  They verbalized understanding and agree with plan.  Further recommendations to follow as clinically indicated Disposition Plan:   Patient is from:  Home  Anticipated DC to:  Home  Anticipated DC date:  Anticipate more than 2 midnight stay in the hospital  Anticipated DC barriers: No barriers to discharge identified at this time  Consults called:  Neurology-ER physician discussed with Neurology who will see pt upon arrival to [facility]. Admission status:  Inpatient   Claudean Severance Delesia Martinek MD [facility]  How to contact the [facility] Attending or Consulting provider  7A - 7P or covering provider during after hours 7P -7A, for this patient?   1. Check the care team in [facility] and look for a) attending/consulting [facility] provider listed and b) the [facility] team listed 2. Log into [email or web address] and use [facility] universal password to access. If you do not have the password, please contact the hospital operator. 3. Locate the [facility] provider you are looking for under [facility] and page to a number that you can be directly reached. 4. If you still have difficulty reaching the provider, please page the [facility] (Director on Call) for the Hospitalists listed on amion for assistance.  01/09/2021, 8:24 PM

## 2021-01-09 NOTE — ED Notes (Addendum)
Patient transported to MRI 

## 2021-01-09 NOTE — ED Notes (Signed)
[facility] called for transport. 

## 2021-01-09 NOTE — ED Notes (Signed)
Pt. Aware urine sample needed. Unable to provide one at this time.

## 2021-01-09 NOTE — ED Triage Notes (Signed)
Pt arrived via walk in, c/o bilateral foot and leg numbness x1 month. Per pt, originally started with bilateral feet, progressing, now woke this morning with it further up legs. No hx of neuropathy. Ambulatory to triage without issue.

## 2021-01-09 NOTE — Consult Note (Signed)
Neurology Consultation  Reason for Consult: Bilateral lower extremity tingling and numbness, abnormal thoracic spine imaging Referring Physician: Dr. Bernette Mayers, ED provider at [facility] [place] [facility]  CC: Bilateral lower extremity tingling and numbness  History is obtained from: Patient, chart  HPI: Jennifer Bullock is a 50 y.o. female past medical history of anxiety, presenting for evaluation of tingling and numbness in feet which has now gone up to her knees over repeated of about a month. She started noticing some tingling in her toes about 4 weeks ago-that she describes as a pins and needle sensation.  It has gradually and slowly moved up and this morning she noted that he would feel the tingling and numbness all the way up to her knees.  No history of paresthesias in the past.  No history of any focal motor weakness episodes in the past.  No history of loss of vision with pain in the past. This tingling and numbness was not preceded by any infection although she did have some diarrhea and stomach issues on a trip that she had made after the tingling and numbness in her toes that started. Her Covid booster was 2021 December. Denies any fevers or chills.  Denies any back pain or neck pain. No history of back injury.  No bowel or bladder issues.  No difficulty with gait.  Came to the emergency room at [facility] for evaluation. Case was discussed over the phone with the on-call neurologist, cervical and thoracic spine imaging was recommended and it showed an abnormal signal centrally at T6 level.  Neurology was called back for further recommendations and was recommended that she get a brain and L-spine MRI as well as a spinal tap-which was completed at [facility].  Pending the brain and lumbar spine MRI because she had already gotten contrast for the first set of MRIs for the cervical and thoracic spine imaging.  Takes vitamins supplementation.  Not on any specific restrictive  diet.  Complains of insomnia and requesting sleep medication for 1 night.  ROS: Obtained and negative except as noted in HPI.Marland Kitchen   Past Medical History:  Diagnosis Date  . Anxiety      History reviewed. No pertinent family history.   Social History:   reports that she has never smoked. She has never used smokeless tobacco. No history on file for alcohol use and drug use.  Medications  Current Facility-Administered Medications:  .  0.9 %  sodium chloride infusion, , Intravenous, Continuous, Chotiner, Claudean Severance, MD .  acetaminophen (TYLENOL) tablet 650 mg, 650 mg, Oral, Q6H PRN **OR** acetaminophen (TYLENOL) suppository 650 mg, 650 mg, Rectal, Q6H PRN, Chotiner, Claudean Severance, MD .  ALPRAZolam Prudy Feeler) tablet 1 mg, 1 mg, Oral, BID PRN, Chotiner, Claudean Severance, MD .  Melene Muller ON 01/10/2021] multivitamin with minerals tablet 1 tablet, 1 tablet, Oral, Daily, Chotiner, Claudean Severance, MD .  Melene Muller ON 01/10/2021] nicotine (NICODERM CQ - dosed in mg/24 hours) patch 14 mg, 14 mg, Transdermal, Daily, Chotiner, Claudean Severance, MD   Exam: Current vital signs: BP 118/76 (BP Location: Right Arm)   Pulse (!) 59   Temp 98.1 F (36.7 C) (Oral)   Resp 16   Ht 5\' 6"  (1.676 m)   Wt 58.1 kg   SpO2 99%   BMI 20.66 kg/m  Vital signs in last 24 hours: Temp:  [98.1 F (36.7 C)] 98.1 F (36.7 C) (03/26 2206) Pulse Rate:  [56-125] 59 (03/26 2206) Resp:  [16-19] 16 (03/26 2206) BP: (101-137)/(69-99)  118/76 (03/26 2206) SpO2:  [93 %-100 %] 99 % (03/26 2206) Weight:  [58.1 kg] 58.1 kg (03/26 1101)  GENERAL: Awake, alert in NAD HEENT: - Normocephalic and atraumatic, dry mm, no LN++, no Thyromegally LUNGS - Clear to auscultation bilaterally with no wheezes CV - S1S2 RRR, no m/r/g, equal pulses bilaterally. ABDOMEN - Soft, nontender, nondistended with normoactive BS Ext: warm, well perfused, intact peripheral pulses, no edema  NEURO:  Mental Status: Awake alert oriented x3.  Normal attention concentration Language:  speech is clear and not dysarthric.  Naming, repetition, fluency, and comprehension intact. Cranial Nerves: PERRL. EOMI, visual fields full, no facial asymmetry, facial sensation intact, hearing intact, tongue/uvula/soft palate midline, normal sternocleidomastoid and trapezius muscle strength. No evidence of tongue atrophy or fibrillations Motor: 5/5 with no drift in any of the 4 extremities.  Normal tone.  Normal range of motion. Tone: is normal and bulk is normal Sensation- Intact to light touch bilaterally to light touch, pinprick and vibration without a clear lower stocking type pattern but subjectively feels that the numbness is worse on the feet compared to the knees. Coordination: FTN intact bilaterally, no ataxia in BLE. Gait- deferred Deep tendon reflexes are 2+ in the upper extremity at the biceps and brachioradialis.  No Hoffman's.  Deep tendon reflexes are mildly brisk in both the knees without clonus at the patella or the ankles.  Ankle jerks are 2+.  Toes are downgoing.   Labs I have reviewed labs in epic and the results pertinent to this consultation are:  CBC    Component Value Date/Time   WBC 7.1 01/09/2021 1137   RBC 5.02 01/09/2021 1137   HGB 15.8 (H) 01/09/2021 1137   HCT 47.8 (H) 01/09/2021 1137   PLT 312 01/09/2021 1137   MCV 95.2 01/09/2021 1137   MCH 31.5 01/09/2021 1137   MCHC 33.1 01/09/2021 1137   RDW 12.2 01/09/2021 1137    CMP     Component Value Date/Time   NA 137 01/09/2021 1137   K 4.0 01/09/2021 1137   CL 102 01/09/2021 1137   CO2 25 01/09/2021 1137   GLUCOSE 104 (H) 01/09/2021 1137   BUN 11 01/09/2021 1137   CREATININE 0.71 01/09/2021 1137   CALCIUM 9.9 01/09/2021 1137   GFRNONAA >60 01/09/2021 1137   Imaging I have reviewed the images obtained:  MRI of the thoracic and cervical spine with and without contrast shows an abnormal cord signal centrally at the T6 level without any enhancement postcontrast.  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  CSF studies Glucose 65, protein 21.  Rest of the studies pending.  Assessment:  50 year old with a gradual progression of tingling and numbness that started in bilateral toes and has now extended up to the knees without a lot of objective sensory findings and intact DTRs and downgoing toes noted to have an abnormal signal centrally at the T6 level in the spinal cord. Differentials include sequela of prior transverse myelitis-although denies any clinical episodes in the past. No preceding illness or sickness prior to his symptoms and presence of brisk reflexes goes against typical Guillain-Barr syndrome but differential should include atypical demyelinating polyneuropathies with predominant sensory component.. That said, pure sensory variant of Guillain-Barr syndrome usually has absent reflexes and in her case also is less likely. Best modality for diagnosis would be nerve  conduction studies-which unfortunately cannot be done inpatient. No ophthalmoplegia or ataxia or areflexia goes against Christianne Dolin syndrome. Search for reversible causes of neuropathy should also be  initiated  Impression:  Ascending lower extremity paresthesias-see differentials above  Insomnia  Recommendations:  Bilateral lower extremity ascending paresthesias:  Brain MRI with and without contrast  L-spine MRI with and without contrast  Await remainder of the CSF results, but given already normal protein, unlikely to be of much more information-blind CSF in terms of cells will rule out chances of infection.  Outpatient EMG nerve conduction studies  I do not feel strongly about using steroids or IVIG given that the deficits are not severe and the progression has been rather slow.  I would first get the nerve conduction studies to see and characterize if this has a demyelinating or axonal form of neuropathy and then choose the treatment modality.  B12, TSH, SPEP, UPEP  CSF studies to include  glucose, protein have already been completed.  Cell counts are pending.  I have added oligoclonal bands and IgG index to the CSF as well as serum, NMO antibodies as well as serum anti-MO G antibodies. I will sign out her case to the oncoming neurologist for any further recommendations.  Insomnia:  One-time dose of Ambien  Neurology will follow with you.  -- Milon Dikes, MD Neurologist [facility] Pager: [phone]

## 2021-01-09 NOTE — ED Notes (Signed)
Report given to [facility]. 

## 2021-01-09 NOTE — ED Provider Notes (Signed)
[facility] EMERGENCY DEPARTMENT Provider Note  CSN: [id number] Arrival date & time: 01/09/21 1039    History Chief Complaint  Patient presents with  . Leg Problem    HPI  Antoine Fiallos is a 50 y.o. female with no significant PMH reports she began having tingling in her toes about a month ago. She reports that since then the tingling has been progressively getting worse until this morning it was all the way up to her knees. She denies any weakness. No fever or back pain. No tingling in fingers. No facial droop, slurred speech or difficulty walking. She was in [place] earlier this month and had a course of doxycycline while she was there 'for her stomach' but her symptoms started prior to her going on her trip. She eats a well balanced diet, not vegan or vegetarian. She has tried Yoga without any change in her symptoms.    Past Medical History:  Diagnosis Date  . Anxiety     Past Surgical History:  Procedure Laterality Date  . ABDOMINAL HYSTERECTOMY    . APPENDECTOMY      History reviewed. No pertinent family history.  Social History   Tobacco Use  . Smoking status: Never Smoker  . Smokeless tobacco: Never Used     Home Medications Prior to Admission medications   Not on File     Allergies    Nsaids   Review of Systems   Review of Systems A comprehensive review of systems was completed and negative except as noted in HPI.    Physical Exam BP (!) 137/94   Pulse (!) 104   Temp 98.1 F (36.7 C)   Resp 18   Ht 5\' 6"  (1.676 m)   Wt 58.1 kg   SpO2 100%   BMI 20.66 kg/m   Physical Exam Vitals and nursing note reviewed.  Constitutional:      Appearance: Normal appearance.  HENT:     Head: Normocephalic and atraumatic.     Nose: Nose normal.     Mouth/Throat:     Mouth: Mucous membranes are moist.  Eyes:     Extraocular Movements: Extraocular movements intact.     Conjunctiva/sclera: Conjunctivae normal.  Cardiovascular:     Rate and Rhythm:  Normal rate.  Pulmonary:     Effort: Pulmonary effort is normal.     Breath sounds: Normal breath sounds.  Abdominal:     General: Abdomen is flat.     Palpations: Abdomen is soft.     Tenderness: There is no abdominal tenderness.  Musculoskeletal:        General: No swelling. Normal range of motion.     Cervical back: Neck supple.  Skin:    General: Skin is warm and dry.  Neurological:     Mental Status: She is alert and oriented to person, place, and time.     Cranial Nerves: No cranial nerve deficit.     Motor: No weakness.     Deep Tendon Reflexes: Reflexes normal.     Comments: Decreased sensation of both LE to the knees  Psychiatric:        Mood and Affect: Mood normal.      ED Results / Procedures / Treatments   Labs (all labs ordered are listed, but only abnormal results are displayed) Labs Reviewed  BASIC METABOLIC PANEL - Abnormal; Notable for the following components:      Result Value   Glucose, Bld 104 (*)    All other  components within normal limits  CBC - Abnormal; Notable for the following components:   Hemoglobin 15.8 (*)    HCT 47.8 (*)    All other components within normal limits  URINALYSIS, ROUTINE W REFLEX MICROSCOPIC    EKG None  Radiology No results found.  Procedures Procedures  Medications Ordered in the ED Medications - No data to display   MDM Rules/Calculators/A&P MDM Patient with progressive paresthesia. CBC and BMP are normal. Spoke with Dr. Amada Jupiter who recommends MRI C- and T-spine w/wo contrast to rule out transverse myelitis.  ED Course  I have reviewed the triage vital signs and the nursing notes.  Pertinent labs & imaging results that were available during my care of the patient were reviewed by me and considered in my medical decision making (see chart for details).  Clinical Course as of 01/10/21 0656  Sat Jan 09, 2021  1457 Urine is negative.  [CS]  1505 Care of the patient signed out to Dr. Rhunette Croft at the  change of shift pending MRI.  [CS]    Clinical Course User Index [CS] Pollyann Savoy, MD    Final Clinical Impression(s) / ED Diagnoses Final diagnoses:  None    Rx / DC Orders ED Discharge Orders    None       Pollyann Savoy, MD 01/10/21 [phone]

## 2021-01-10 ENCOUNTER — Inpatient Hospital Stay (HOSPITAL_COMMUNITY): Payer: Managed Care, Other (non HMO)

## 2021-01-10 LAB — VITAMIN B12
Vitamin B-12: 542 pg/mL (ref 180–914)
Vitamin B-12: 604 pg/mL (ref 180–914)

## 2021-01-10 LAB — CBC
HCT: 44.1 % (ref 36.0–46.0)
Hemoglobin: 15 g/dL (ref 12.0–15.0)
MCH: 31.7 pg (ref 26.0–34.0)
MCHC: 34 g/dL (ref 30.0–36.0)
MCV: 93.2 fL (ref 80.0–100.0)
Platelets: 307 10*3/uL (ref 150–400)
RBC: 4.73 MIL/uL (ref 3.87–5.11)
RDW: 12.1 % (ref 11.5–15.5)
WBC: 7.4 10*3/uL (ref 4.0–10.5)
nRBC: 0 % (ref 0.0–0.2)

## 2021-01-10 LAB — COMPREHENSIVE METABOLIC PANEL
ALT: 14 U/L (ref 0–44)
AST: 18 U/L (ref 15–41)
Albumin: 4.2 g/dL (ref 3.5–5.0)
Alkaline Phosphatase: 61 U/L (ref 38–126)
Anion gap: 6 (ref 5–15)
BUN: 10 mg/dL (ref 6–20)
CO2: 30 mmol/L (ref 22–32)
Calcium: 9.8 mg/dL (ref 8.9–10.3)
Chloride: 102 mmol/L (ref 98–111)
Creatinine, Ser: 0.85 mg/dL (ref 0.44–1.00)
GFR, Estimated: >60 mL/min (ref >60)
Glucose, Bld: 107 mg/dL — ABNORMAL HIGH (ref 70–99)
Potassium: 4.1 mmol/L (ref 3.5–5.1)
Sodium: 138 mmol/L (ref 135–145)
Total Bilirubin: 0.7 mg/dL (ref 0.3–1.2)
Total Protein: 7.1 g/dL (ref 6.5–8.1)

## 2021-01-10 LAB — HIV ANTIBODY (ROUTINE TESTING W REFLEX): HIV Screen 4th Generation wRfx: NONREACTIVE

## 2021-01-10 LAB — TSH: TSH: 2.234 u[IU]/mL (ref 0.350–4.500)

## 2021-01-10 LAB — SARS CORONAVIRUS 2 (TAT 6-24 HRS): SARS Coronavirus 2: NEGATIVE

## 2021-01-10 IMAGING — MR MR LUMBAR SPINE W/O CM
4 of 5 series · 19 of 48 positions shown · non-contrast
Comparison: None.

CLINICAL DATA: Demyelinating disease.

EXAM:
MRI LUMBAR SPINE WITHOUT CONTRAST
TECHNIQUE: Multiplanar, multisequence MR imaging of the lumbar spine was
performed. No intravenous contrast was administered.

[Series 3: T2 · sagittal · 4.0mm · 0.55mm/px · 6 of 15 slices shown (1 of 2)]
[im 1/15]
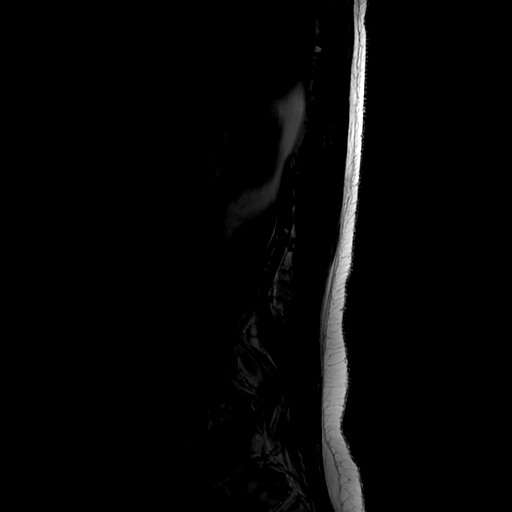
[im 3/15]
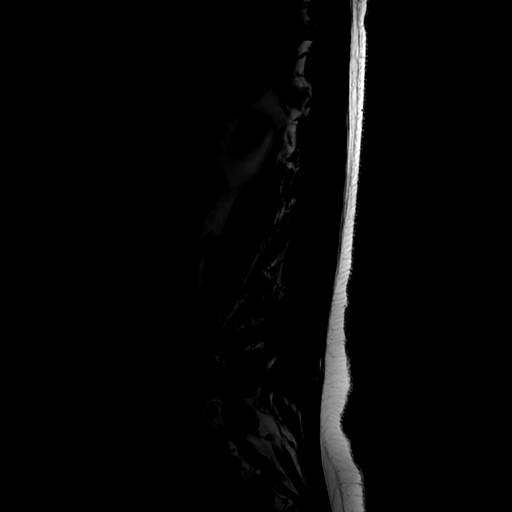
[im 6/15]
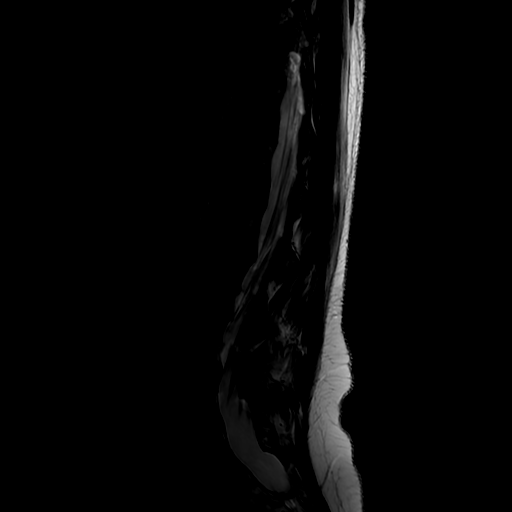
[im 9/15]
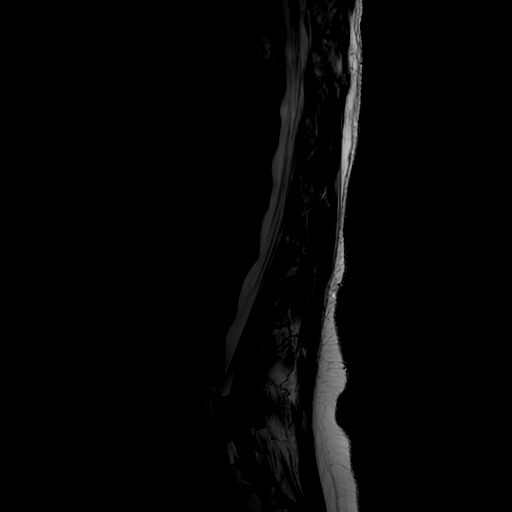
[im 12/15]
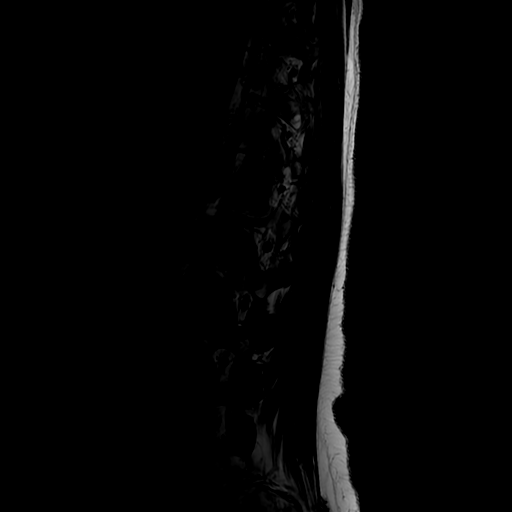
[im 15/15]
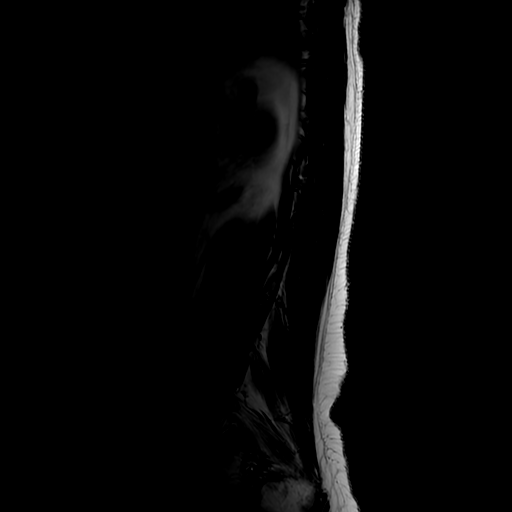

[Series 5: T1 · sagittal · 4.0mm · 0.55mm/px · 3 of 15 slices shown (1 of 2)]
[im 3/15]
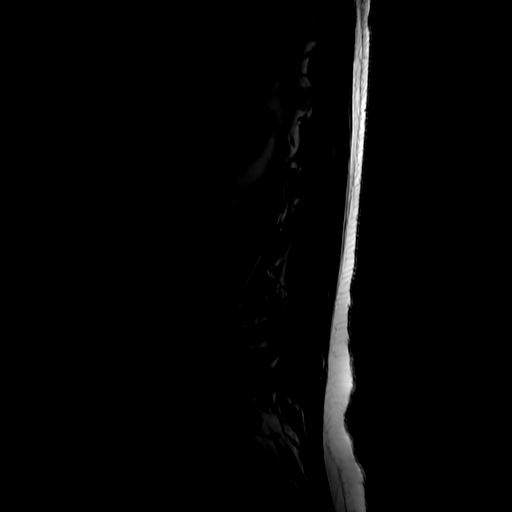
[im 9/15]
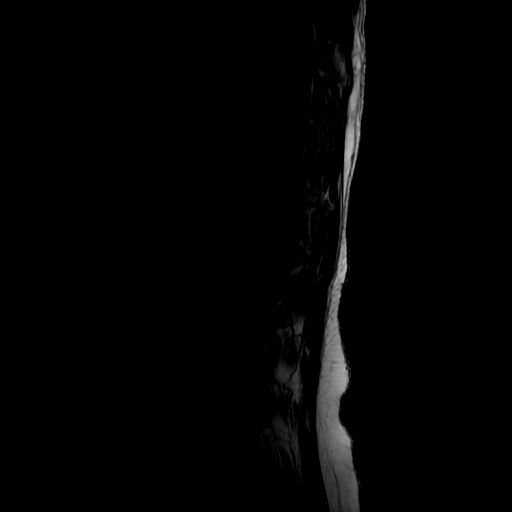
[im 15/15]
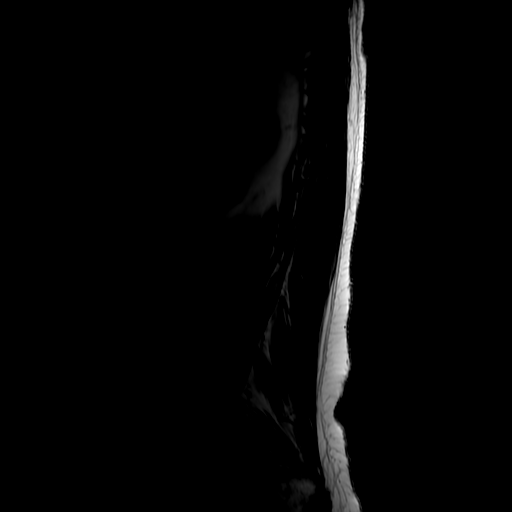

[Series 6: T2 · axial · 4.0mm · 0.39mm/px · z∈[-144,+36]mm · 7 of 40 slices shown (2 of 2)]
[im 1/40]
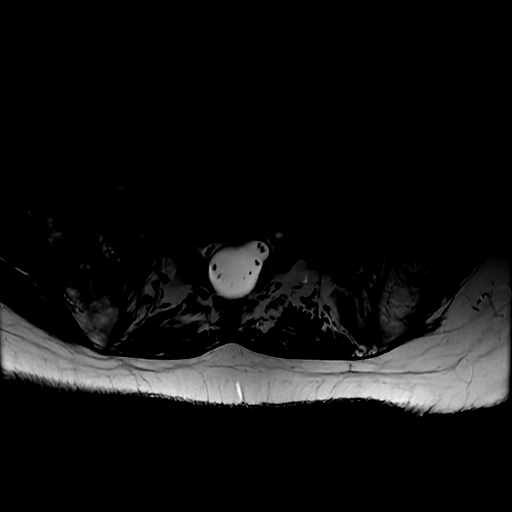
[im 6/40]
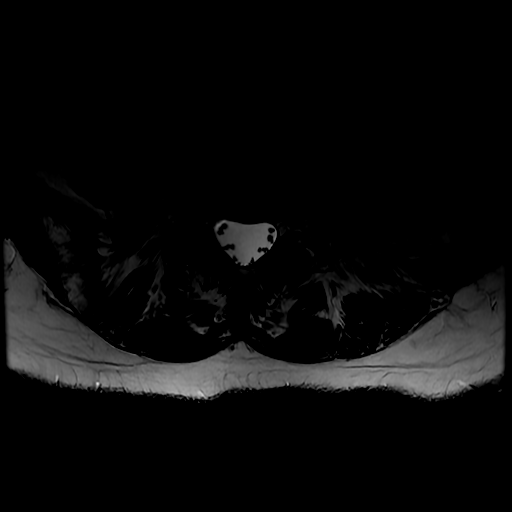
[im 12/40]
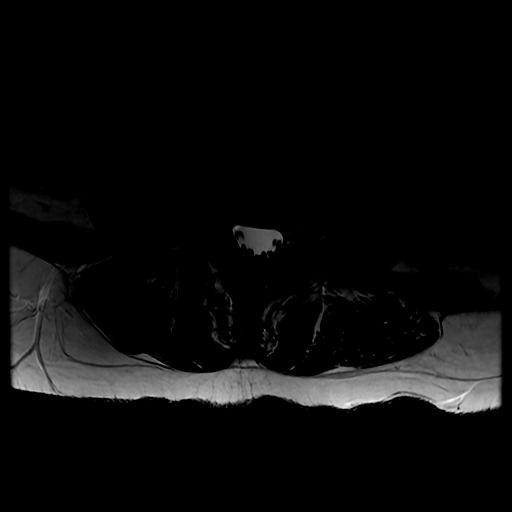
[im 17/40]
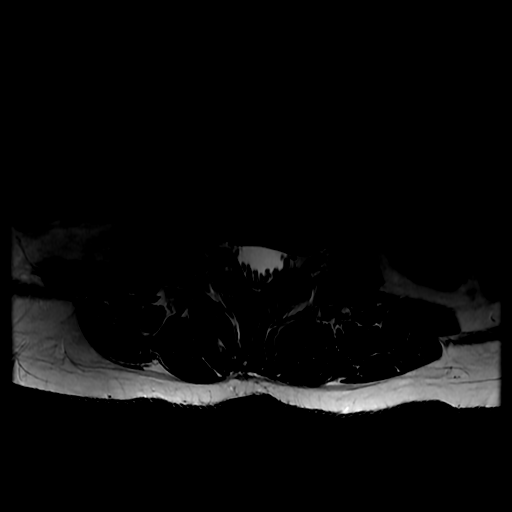
[im 20/40]
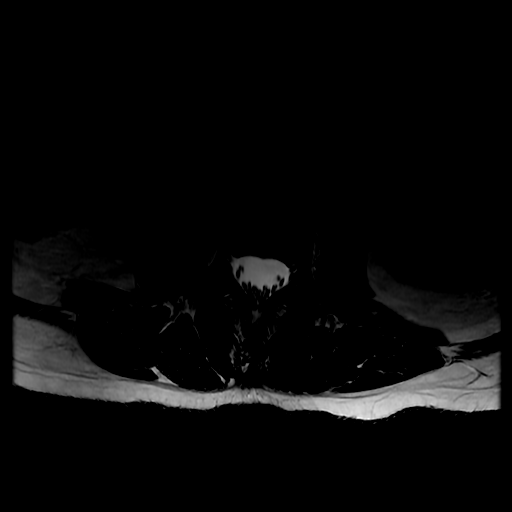
[im 23/40]
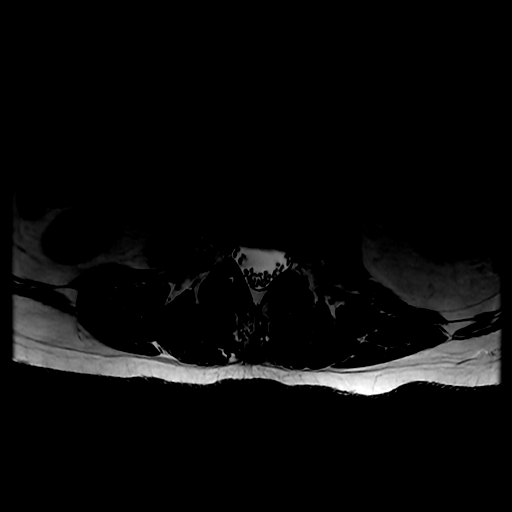
[im 34/40]
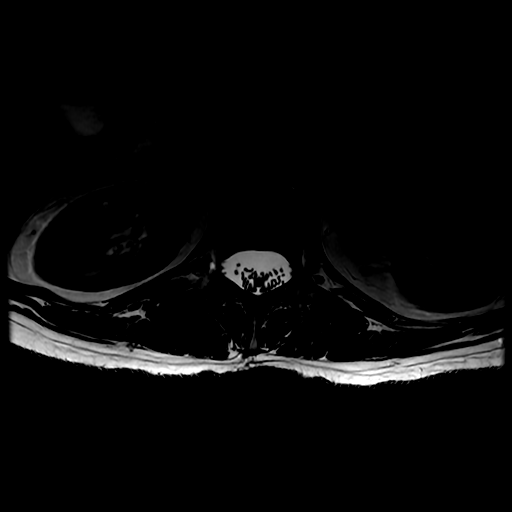

[Series 7: T1 · axial · 4.0mm · 0.39mm/px · z∈[-119,+36]mm · 3 of 40 slices shown (2 of 2)]
[im 6/40]
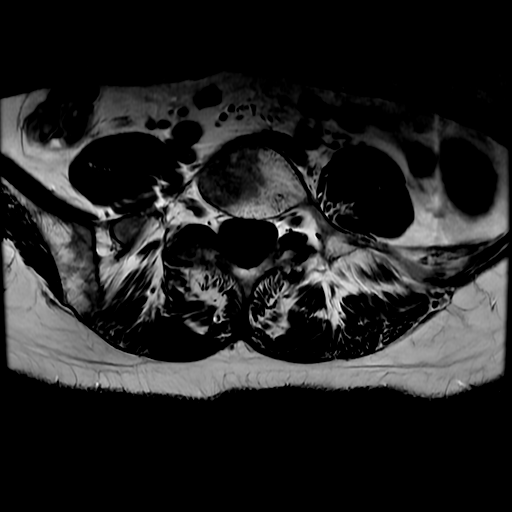
[im 20/40]
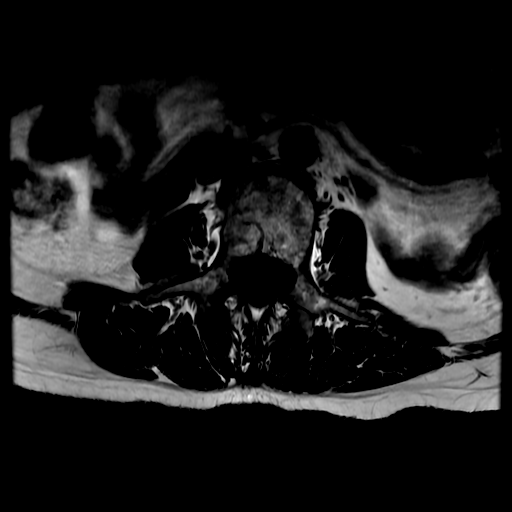
[im 34/40]
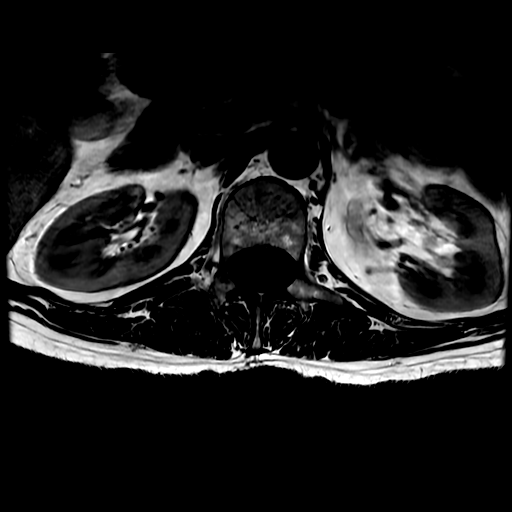

[19 of 48 positions shown; findings below may reference images not displayed]

FINDINGS: Segmentation:  5 lumbar type vertebrae

Alignment:  Degenerative grade 1 anterolisthesis at L4-5.

Vertebrae: Benign appearing heterogeneity of marrow. No fracture,
evidence of discitis, or aggressive bone lesion. T11 body hemangioma

Conus medullaris and cauda equina: Conus extends to the L1 level.
Conus and cauda equina appear normal.

Paraspinal and other soft tissues: Negative.

Disc levels:

T12- L1: Unremarkable.

L1-L2: Unremarkable.

L2-L3: Disc narrowing and bulging with small central protrusion.

L3-L4: Mild disc narrowing.  Borderline facet spurring

L4-L5: Degenerative facet spurring with mild anterolisthesis. Mild
disc narrowing and bulging.

L5-S1:Unremarkable.
IMPRESSION: 1. Normal appearance of the conus and cauda equina.
2. Degenerative changes not causing neural compression, as above.

## 2021-01-10 IMAGING — MR MR HEAD W/O CM
8 of 14 series · 21 of 48 positions shown · non-contrast
Comparison: None similar

CLINICAL DATA: Demyelinating disease

EXAM:
MRI HEAD WITHOUT CONTRAST
TECHNIQUE: Multiplanar, multiecho pulse sequences of the brain and surrounding
structures were obtained without intravenous contrast.

[Series 2: DWI · axial · 3.0mm · 0.94mm/px · z∈[-74,+70]mm · 4 of 100 slices shown (1 of 2)]
[im 1/100]
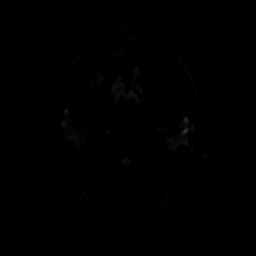
[im 34/100]
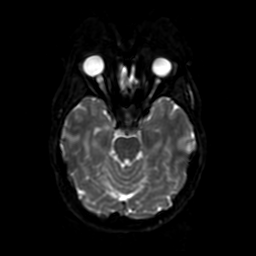
[im 67/100]
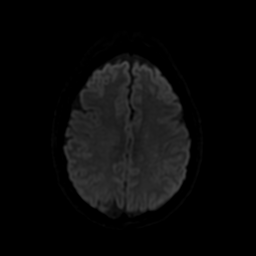
[im 100/100]
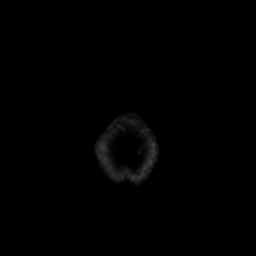

[Series 3: FLAIR · sagittal · 5.0mm · 0.21mm/px · 1 of 25 slices shown (1 of 3)]
[im 1/25]
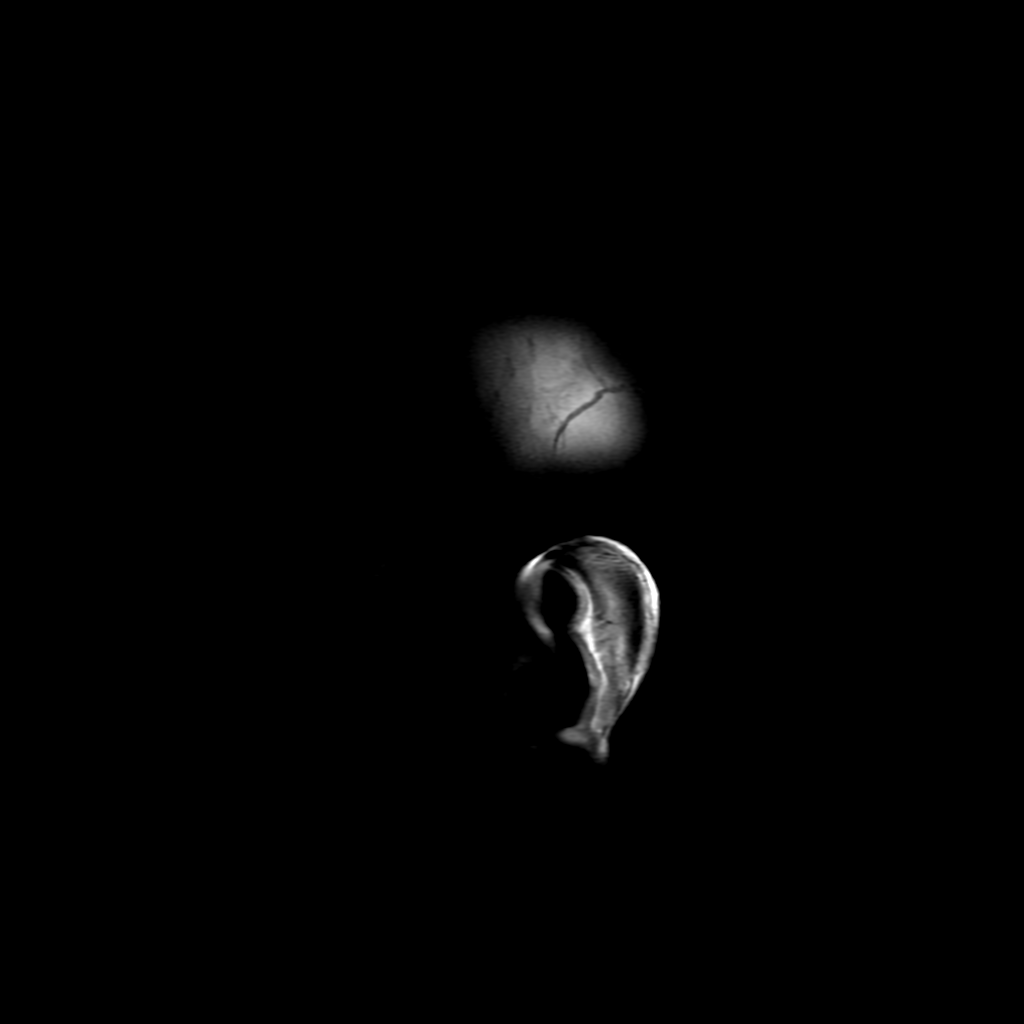

[Series 4: DWI · coronal · 4.0mm · 0.94mm/px · 3 of 74 slices shown (2 of 2)]
[im 1/74]
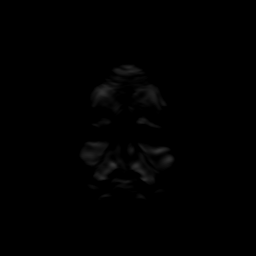
[im 37/74]
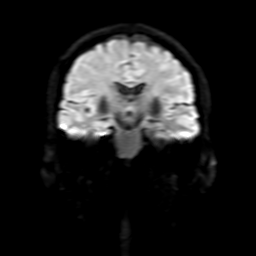
[im 74/74]
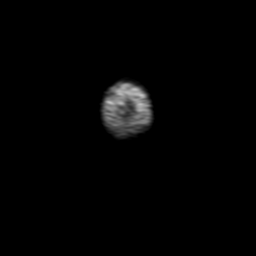

[Series 5: T2 · axial · 5.0mm · 0.23mm/px · 1 of 26 slices shown]
[im 1/26]
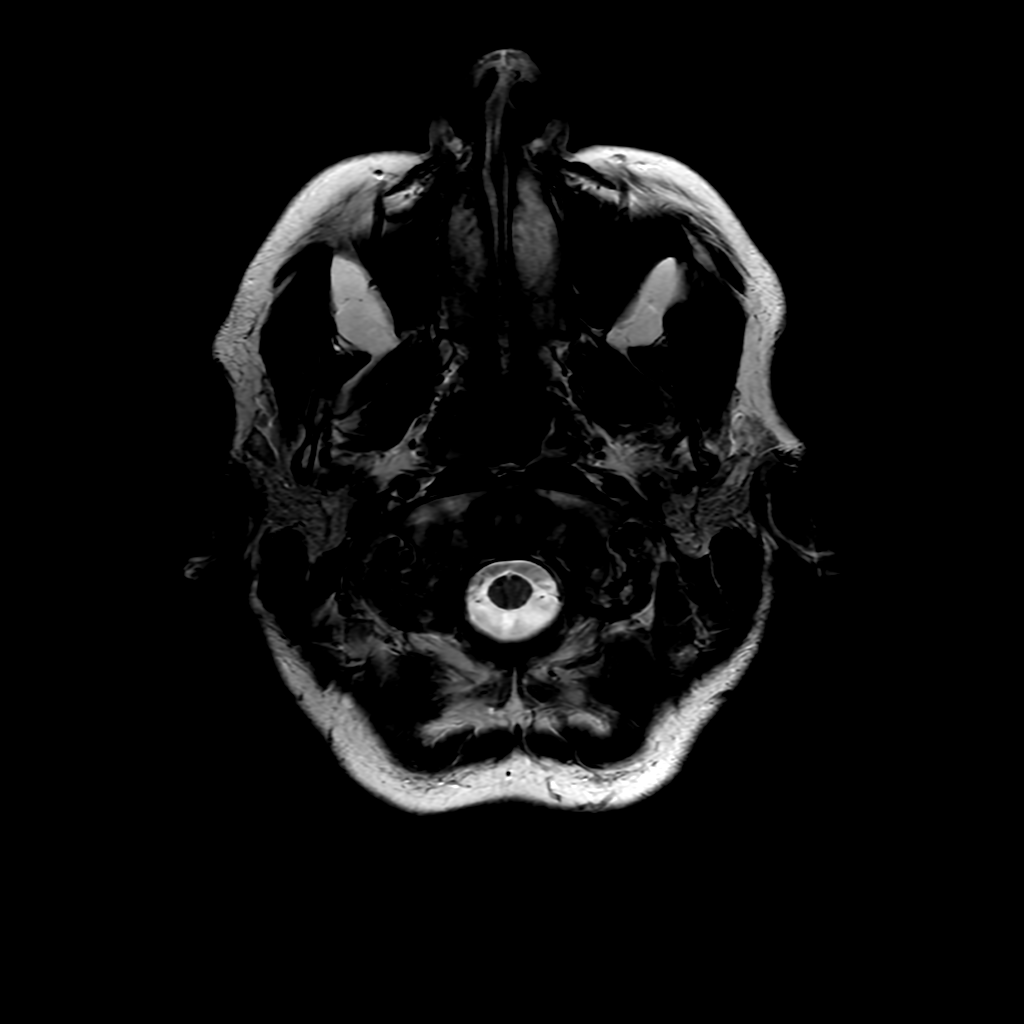

[Series 6: FLAIR · axial · 3.0mm · 0.47mm/px · 1 of 26 slices shown (2 of 3)]
[im 1/26]
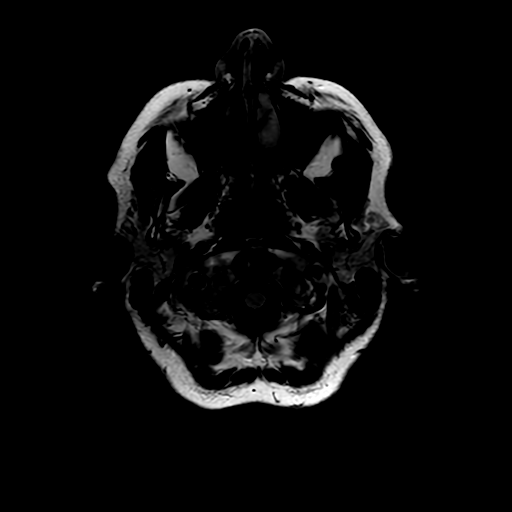

[Series 10: FLAIR · sagittal · 1.6mm · 0.45mm/px · 8 of 228 slices shown (3 of 3)]
[im 1/228]
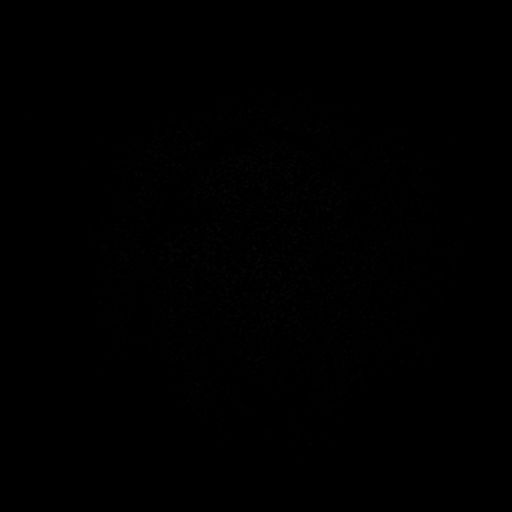
[im 33/228]
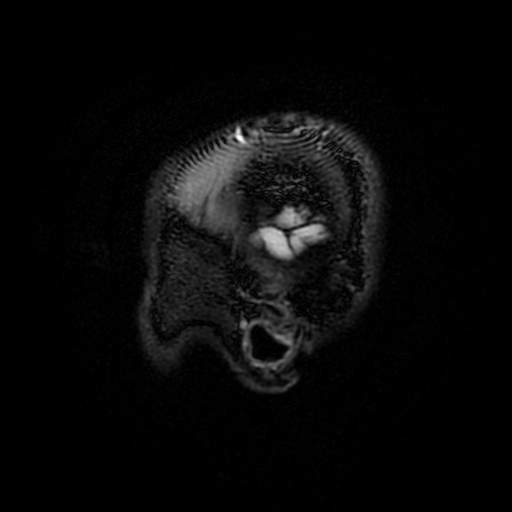
[im 65/228]
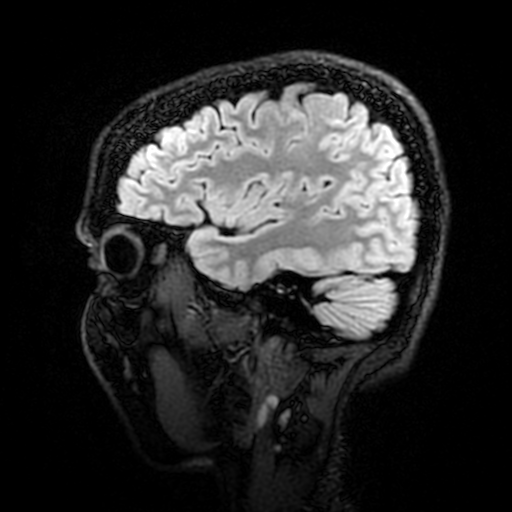
[im 98/228]
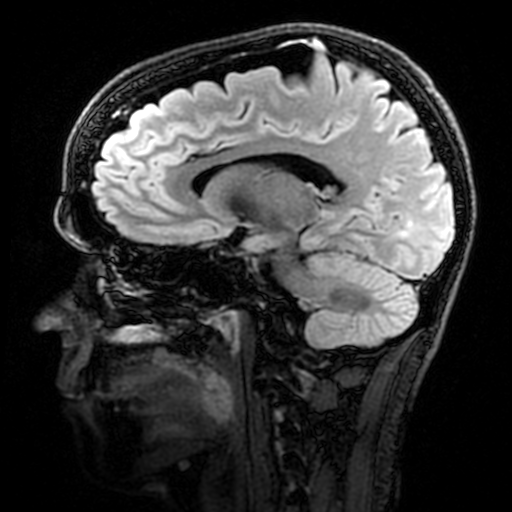
[im 130/228]
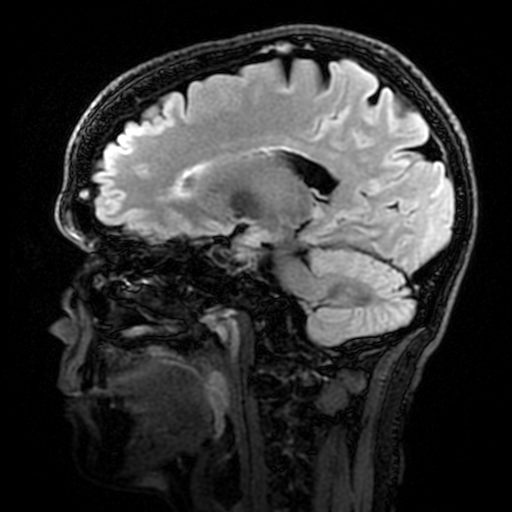
[im 163/228]
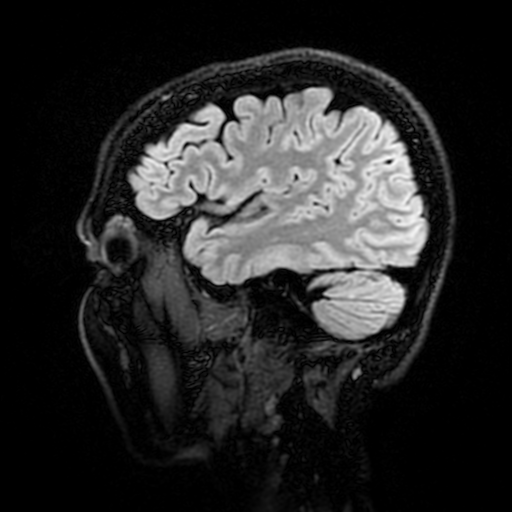
[im 195/228]
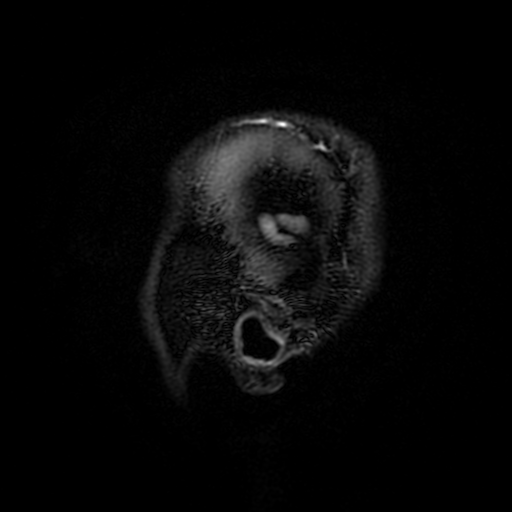
[im 228/228]
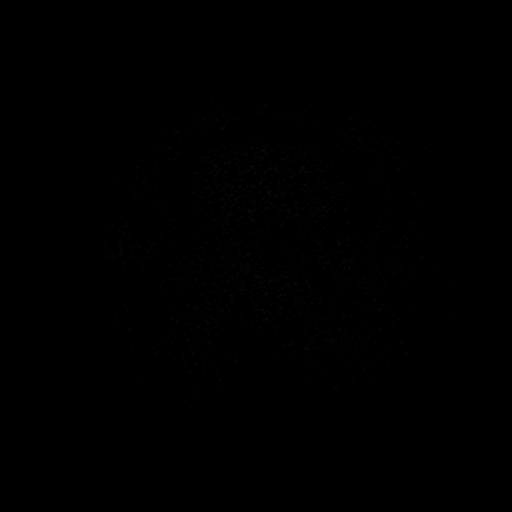

[Series 250: ADC · axial · 3.0mm · 0.94mm/px · z∈[-74,+70]mm · 2 of 50 slices shown (1 of 2)]
[im 1/50]
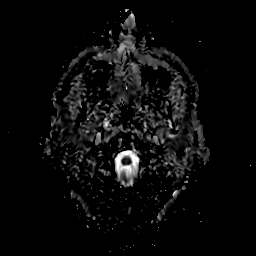
[im 50/50]
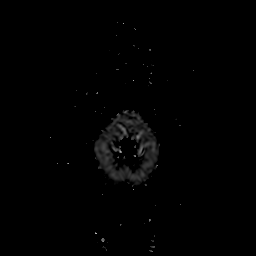

[Series 450: ADC · coronal · 4.0mm · 0.94mm/px · 1 of 37 slices shown (2 of 2)]
[im 1/37]
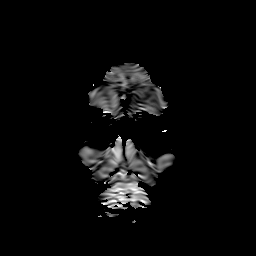

[21 of 48 positions shown; findings below may reference images not displayed]

FINDINGS: Brain: Between 10 and 20 small FLAIR hyperintensities in the
cerebral white matter which are periventricular, deep, and
juxtacortical. Some of these insults have an ovoid shape
perpendicular to the lateral ventricles. Similar finding seen in the
left brachium pontis and peripheral right cerebellum. No restricted
diffusion, hydrocephalus, mass, or collection.

Vascular: Normal flow voids

Skull and upper cervical spine: Normal marrow signal

Sinuses/Orbits: Negative
IMPRESSION: Small scattered insults primarily suspicious for demyelination given
spinal cord findings and history.

## 2021-01-10 MED ORDER — THIAMINE HCL 100 MG PO TABS: 200.0000 mg | ORAL_TABLET | Freq: Every day | ORAL | Status: DC

## 2021-01-10 NOTE — Progress Notes (Signed)
MRI L-spine without contrast:  1. Normal appearance of the conus and cauda equina. 2. Degenerative changes not causing neural compression, as above.  MRI brain  Official Radiology report conclusions:  Small scattered insults primarily suspicious for demyelination given spinal cord findings and history. Neurology review of the images: Minimal scattered T2 hyperintensities within the white matter of the cerebral hemispheres are nonspecific. In the context of the patient's age they are most likely incidental.   Vitamin B12: Normal  CSF: Normal protein and glucose. WBC of 1 (normal). Lab sendouts are pending and will take several days to result.   A/R: 50 year old female with paresthesias in her lower extremities - Preserved reflexes and normal LP essentially rule out rare sensory variant of GBS - No focal lesions to account for her numbness. Her T-spine hazy central cord T2 hyperintensity appears chronic, without associated cord swelling or enhancement. The finding, which is bland in appearance, may also be artifactual. It also does not correspond neuroanatomically with the patient's symptoms.  - Will need outpatient Neurology follow up for EMG/NCS and review of CSF lab send-outs when they are resulted.  - Neurohospitalist service will sign off. Please call if there are additional questions.   Electronically signed: Dr. Caryl Pina

## 2021-01-10 NOTE — Discharge Instructions (Signed)
Follow with Primary MD Dellinger, Romelle Starcher., MD in 7 days   Get CBC, CMP,  checked  by Primary MD next visit.    Activity: As tolerated with Full fall precautions use walker/cane & assistance as needed   Disposition Home    Diet: Regular Diet    On your next visit with your primary care physician please Get Medicines reviewed and adjusted.   Please request your Prim.MD to go over all Hospital Tests and Procedure/Radiological results at the follow up, please get all Hospital records sent to your Prim MD by signing hospital release before you go home.   If you experience worsening of your admission symptoms, develop shortness of breath, life threatening emergency, suicidal or homicidal thoughts you must seek medical attention immediately by calling 911 or calling your MD immediately  if symptoms less severe.  You Must read complete instructions/literature along with all the possible adverse reactions/side effects for all the Medicines you take and that have been prescribed to you. Take any new Medicines after you have completely understood and accpet all the possible adverse reactions/side effects.   Do not drive, operating heavy machinery, perform activities at heights, swimming or participation in water activities or provide baby sitting services if your were admitted for syncope or siezures until you have seen by Primary MD or a Neurologist and advised to do so again.  Do not drive when taking Pain medications.    Do not take more than prescribed Pain, Sleep and Anxiety Medications  Special Instructions: If you have smoked or chewed Tobacco  in the last 2 yrs please stop smoking, stop any regular Alcohol  and or any Recreational drug use.  Wear Seat belts while driving.   Please note  You were cared for by a hospitalist during your hospital stay. If you have any questions about your discharge medications or the care you received while you were in the hospital after you are  discharged, you can call the unit and asked to speak with the hospitalist on call if the hospitalist that took care of you is not available. Once you are discharged, your primary care physician will handle any further medical issues. Please note that NO REFILLS for any discharge medications will be authorized once you are discharged, as it is imperative that you return to your primary care physician (or establish a relationship with a primary care physician if you do not have one) for your aftercare needs so that they can reassess your need for medications and monitor your lab values.

## 2021-01-10 NOTE — Discharge Summary (Signed)
Jennifer Bullock, is a 50 y.o. female  DOB Nov 20, 1970  MRN [id number].  Admission date:  01/09/2021  Admitting Physician  Carlton Adam, MD  Discharge Date:  01/10/2021   Primary MD  Dellinger, Romelle Starcher., MD  Recommendations for primary care physician for things to follow:  -To follow with neurology as an outpatient regarding EMG/nerve conduction study, and to review CSF labs send out when they are resulted) oligoclonal bands and IgG index to the CSF as well as serum, NMO antibodies as well as serum anti-MO G antibodies). -Please follow on thiamine level.   Admission Diagnosis  Neuropathy [G62.9]   Discharge Diagnosis  Neuropathy [G62.9]    Principal Problem:   Neuropathy Active Problems:   Abnormal computed tomography of thoracic spine   Anxiety      Past Medical History:  Diagnosis Date  . Anxiety     Past Surgical History:  Procedure Laterality Date  . ABDOMINAL HYSTERECTOMY    . APPENDECTOMY         History of present illness and  Hospital Course:     Kindly see H&P for history of present illness and admission details, please review complete Labs, Consult reports and Test reports for all details in brief  HPI  from the history and physical done on the day of admission 01/09/2021  HPI: Jennifer Bullock is a 50 y.o. female with medical history of anxiety and perimenopausal who presents for evaluation of decreased sensation in feet and legs. Jennifer Bullock reports she began having tingling in her toes about a month ago. She reports that since then the tingling has been progressively getting worse and has crept up her legs. This morning it was all the way up to her knees. She denies any weakness. No fever or back pain. No tingling in fingers. No facial droop, slurred speech or difficulty walking. The tingling and numbness is in both feet and legs. She was in [place] earlier this month  and had a course of doxycycline while she was there 'for her stomach' which she attributed to food poisoning while she was there. The symptoms started prior to her going on her trip. She eats a well balanced diet, not vegan or vegetarian. She has tried Yoga without any change in her symptoms.She had her Covid vaccines in March/April 2021 with booster in December 2021. No sick contacts. No new pets or plants in home. She does smoke cigarettes but has been cutting back and using vape products.  She does report she has tried marijuana for symptoms a few weeks ago.  She has not used any marijuana in the last 7 to 10 days.  ED Course: Patient had CT of her neck and thoracic spine.  She had a nonenhancing lesion at the T6 level suspicious for a chronic demyelination.  Patient is hemodynamically stable and lab work is otherwise unremarkable.   Hospital Course    Bilateral lower extremity ascending paresthesias: -Neurology input greatly appreciated, at this point patient can be discharged with recommendation  for further work-up as an outpatient with neurology, for EMG/NCS, and to follow on CSF lab send out when they are resulted, B12 within normal limit, will obtain thiamine level before discharge as thiamine deficiency may contribute to peripheral neuropathy, she will be discharged on thiamine supplement pending thiamine level  results which should be followed by neurology follow-up appointment or PCP.  Per Neurology follow-up note from today  "Neurology review of the images: Minimal scattered T2 hyperintensities within the white matter of the cerebral hemispheres are nonspecific. In the context of the patient's age they are most likely incidental" - Preserved reflexes and normal LP essentially rule out rare sensory variant of GBS - No focal lesions to account for her numbness. Her T-spine hazy central cord T2 hyperintensity appears chronic, without associated cord swelling or enhancement. The finding,  which is bland in appearance, may also be artifactual. It also does not correspond neuroanatomically with the patient's symptoms   Discharge Condition:  stable   Discharge Instructions  and  Discharge Medications    Discharge Instructions    Ambulatory referral to Neurology   Complete by: As directed    An appointment is requested in approximately: 2 weeks -follow up for EMG/NCS and review of CSF lab send-outs when they are resulted. oligoclonal bands and IgG index to the CSF as well as serum, NMO antibodies as well as serum anti-MO G antibodies(myelin basic protein).   Discharge instructions   Complete by: As directed    Follow with Primary MD Dellinger, Romelle Starcher., MD in 7 days   Get CBC, CMP,  checked  by Primary MD next visit.    Activity: As tolerated with Full fall precautions use walker/cane & assistance as needed   Disposition Home    Diet: Regular Diet    On your next visit with your primary care physician please Get Medicines reviewed and adjusted.   Please request your Prim.MD to go over all Hospital Tests and Procedure/Radiological results at the follow up, please get all Hospital records sent to your Prim MD by signing hospital release before you go home.   If you experience worsening of your admission symptoms, develop shortness of breath, life threatening emergency, suicidal or homicidal thoughts you must seek medical attention immediately by calling 911 or calling your MD immediately  if symptoms less severe.  You Must read complete instructions/literature along with all the possible adverse reactions/side effects for all the Medicines you take and that have been prescribed to you. Take any new Medicines after you have completely understood and accpet all the possible adverse reactions/side effects.   Do not drive, operating heavy machinery, perform activities at heights, swimming or participation in water activities or provide baby sitting services if your  were admitted for syncope or siezures until you have seen by Primary MD or a Neurologist and advised to do so again.  Do not drive when taking Pain medications.    Do not take more than prescribed Pain, Sleep and Anxiety Medications  Special Instructions: If you have smoked or chewed Tobacco  in the last 2 yrs please stop smoking, stop any regular Alcohol  and or any Recreational drug use.  Wear Seat belts while driving.   Please note  You were cared for by a hospitalist during your hospital stay. If you have any questions about your discharge medications or the care you received while you were in the hospital after you are discharged, you can call the unit and asked to speak  with the hospitalist on call if the hospitalist that took care of you is not available. Once you are discharged, your primary care physician will handle any further medical issues. Please note that NO REFILLS for any discharge medications will be authorized once you are discharged, as it is imperative that you return to your primary care physician (or establish a relationship with a primary care physician if you do not have one) for your aftercare needs so that they can reassess your need for medications and monitor your lab values.   Increase activity slowly   Complete by: As directed      Allergies as of 01/10/2021      Reactions   Nsaids Nausea And Vomiting      Medication List    TAKE these medications   acetaminophen 325 MG tablet Commonly known as: TYLENOL Take 650 mg by mouth every 6 (six) hours as needed for mild pain, fever or headache.   ALPRAZolam 1 MG tablet Commonly known as: XANAX Take 1 mg by mouth 2 (two) times daily as needed for anxiety.   amphetamine-dextroamphetamine 20 MG tablet Commonly known as: ADDERALL Take 20 mg by mouth 2 (two) times daily.   estradiol 1 MG tablet Commonly known as: ESTRACE Take 0.5-1 mg by mouth daily.   multivitamin with minerals Tabs tablet Take 1 tablet  by mouth daily.   thiamine 100 MG tablet Take 2 tablets (200 mg total) by mouth daily.         Diet and Activity recommendation: See Discharge Instructions above   Consults obtained -  Neurolgoy   Major procedures and Radiology Reports - PLEASE review detailed and final reports for all details, in brief -      MR BRAIN WO CONTRAST  Result Date: 01/10/2021 CLINICAL DATA:  Demyelinating disease EXAM: MRI HEAD WITHOUT CONTRAST TECHNIQUE: Multiplanar, multiecho pulse sequences of the brain and surrounding structures were obtained without intravenous contrast. COMPARISON:  None similar FINDINGS: Brain: Between 10 and 20 small FLAIR hyperintensities in the cerebral white matter which are periventricular, deep, and juxtacortical. Some of these insults have an ovoid shape perpendicular to the lateral ventricles. Similar finding seen in the left brachium pontis and peripheral right cerebellum. No restricted diffusion, hydrocephalus, mass, or collection. Vascular: Normal flow voids Skull and upper cervical spine: Normal marrow signal Sinuses/Orbits: Negative IMPRESSION: Small scattered insults primarily suspicious for demyelination given spinal cord findings and history. Electronically Signed   By: Marnee Spring M.D.   On: 01/10/2021 05:40   MR LUMBAR SPINE WO CONTRAST  Result Date: 01/10/2021 CLINICAL DATA:  Demyelinating disease. EXAM: MRI LUMBAR SPINE WITHOUT CONTRAST TECHNIQUE: Multiplanar, multisequence MR imaging of the lumbar spine was performed. No intravenous contrast was administered. COMPARISON:  None. FINDINGS: Segmentation:  5 lumbar type vertebrae Alignment:  Degenerative grade 1 anterolisthesis at L4-5. Vertebrae: Benign appearing heterogeneity of marrow. No fracture, evidence of discitis, or aggressive bone lesion. T11 body hemangioma Conus medullaris and cauda equina: Conus extends to the L1 level. Conus and cauda equina appear normal. Paraspinal and other soft tissues: Negative.  Disc levels: T12- L1: Unremarkable. L1-L2: Unremarkable. L2-L3: Disc narrowing and bulging with small central protrusion. L3-L4: Mild disc narrowing.  Borderline facet spurring L4-L5: Degenerative facet spurring with mild anterolisthesis. Mild disc narrowing and bulging. L5-S1:Unremarkable. IMPRESSION: 1. Normal appearance of the conus and cauda equina. 2. Degenerative changes not causing neural compression, as above. Electronically Signed   By: Marnee Spring M.D.   On: 01/10/2021 05:43  MR Cervical Spine W or Wo Contrast  Result Date: 01/09/2021 CLINICAL DATA:  Numbness or tingling; technologist note states bilateral foot and leg numbness EXAM: MRI CERVICAL AND THORACIC SPINE WITHOUT AND WITH CONTRAST TECHNIQUE: Multiplanar and multiecho pulse sequences of the cervical spine, to include the craniocervical junction and cervicothoracic junction, and the thoracic spine, were obtained without and with intravenous contrast. CONTRAST:  5mL GADAVIST GADOBUTROL 1 MMOL/ML IV SOLN COMPARISON:  None. FINDINGS: MRI CERVICAL SPINE Alignment: Trace retrolisthesis at C5-C6. Vertebrae: Vertebral body heights are maintained.  No marrow edema. Cord: No abnormal signal or enhancement. Posterior Fossa, vertebral arteries, paraspinal tissues: Unremarkable. Disc levels: Intervertebral disc heights and signal are maintained. Mild multilevel facet hypertrophy. Disc bulges at C5-C6 and C6-C7. No significant canal or foraminal stenosis. MRI THORACIC SPINE Alignment:  Preserved Vertebrae: Vertebral body heights are maintained. Probable vertebral body hemangioma at T11. No substantial marrow edema. Cord:  Abnormal signal centrally at the T6 level.  No enhancement. Paraspinal and other soft tissues: Unremarkable. Disc levels: Intervertebral disc heights and signal are maintained. There is no disc herniation or canal or foraminal stenosis at any level. IMPRESSION: Abnormal cord signal without enhancement at the T6 level. May reflect  chronic demyelination. Electronically Signed   By: Guadlupe SpanishPraneil  Patel M.D.   On: 01/09/2021 17:08   MR THORACIC SPINE W WO CONTRAST  Result Date: 01/09/2021 CLINICAL DATA:  Numbness or tingling; technologist note states bilateral foot and leg numbness EXAM: MRI CERVICAL AND THORACIC SPINE WITHOUT AND WITH CONTRAST TECHNIQUE: Multiplanar and multiecho pulse sequences of the cervical spine, to include the craniocervical junction and cervicothoracic junction, and the thoracic spine, were obtained without and with intravenous contrast. CONTRAST:  5mL GADAVIST GADOBUTROL 1 MMOL/ML IV SOLN COMPARISON:  None. FINDINGS: MRI CERVICAL SPINE Alignment: Trace retrolisthesis at C5-C6. Vertebrae: Vertebral body heights are maintained.  No marrow edema. Cord: No abnormal signal or enhancement. Posterior Fossa, vertebral arteries, paraspinal tissues: Unremarkable. Disc levels: Intervertebral disc heights and signal are maintained. Mild multilevel facet hypertrophy. Disc bulges at C5-C6 and C6-C7. No significant canal or foraminal stenosis. MRI THORACIC SPINE Alignment:  Preserved Vertebrae: Vertebral body heights are maintained. Probable vertebral body hemangioma at T11. No substantial marrow edema. Cord:  Abnormal signal centrally at the T6 level.  No enhancement. Paraspinal and other soft tissues: Unremarkable. Disc levels: Intervertebral disc heights and signal are maintained. There is no disc herniation or canal or foraminal stenosis at any level. IMPRESSION: Abnormal cord signal without enhancement at the T6 level. May reflect chronic demyelination. Electronically Signed   By: Guadlupe SpanishPraneil  Patel M.D.   On: 01/09/2021 17:08    Micro Results    Recent Results (from the past 240 hour(s))  SARS CORONAVIRUS 2 (TAT 6-24 HRS) Nasopharyngeal Nasopharyngeal Swab     Status: None   Collection Time: 01/09/21  8:41 PM   Specimen: Nasopharyngeal Swab  Result Value Ref Range Status   SARS Coronavirus 2 NEGATIVE NEGATIVE Final     Comment: (NOTE) SARS-CoV-2 target nucleic acids are NOT DETECTED.  The SARS-CoV-2 RNA is generally detectable in upper and lower respiratory specimens during the acute phase of infection. Negative results do not preclude SARS-CoV-2 infection, do not rule out co-infections with other pathogens, and should not be used as the sole basis for treatment or other patient management decisions. Negative results must be combined with clinical observations, patient history, and epidemiological information. The expected result is Negative.  Fact Sheet for Patients: HairSlick.nohttps://www.fda.gov/media/138098/download  Fact Sheet for Healthcare Providers: quierodirigir.comhttps://www.fda.gov/media/138095/download  This test is not yet approved or cleared by the [place] and  has been authorized for detection and/or diagnosis of SARS-CoV-2 by FDA under an Emergency Use Authorization (EUA). This EUA will remain  in effect (meaning this test can be used) for the duration of the COVID-19 declaration under Se ction 564(b)(1) of the Act, 21 U.S.C. section 360bbb-3(b)(1), unless the authorization is terminated or revoked sooner.  Performed at [facility], [address] [place], [place] [address]        Today   Subjective:   Jennifer Bullock today reports she is feeling tired, generalized weakness, reports tingling numbness in lower extremity with no  change.  Objective:   Blood pressure 118/76, pulse (!) 59, temperature 98.1 F (36.7 C), temperature source Oral, resp. rate 16, height 5\' 6"  (1.676 m), weight 57.7 kg, SpO2 99 %.   Intake/Output Summary (Last 24 hours) at 01/10/2021 1137 Last data filed at 01/10/2021 0400 Gross per 24 hour  Intake 488.83 ml  Output --  Net 488.83 ml    Exam Awake Alert, Oriented x 3, No new F.N deficits, Normal affect Symmetrical Chest wall movement, Good air movement bilaterally, CTAB RRR No Cyanosis, Clubbing or edema, No new Rash or bruise  Data Review    CBC w Diff:  Lab Results  Component Value Date   WBC 7.4 01/10/2021   HGB 15.0 01/10/2021   HCT 44.1 01/10/2021   PLT 307 01/10/2021    CMP:  Lab Results  Component Value Date   NA 138 01/10/2021   K 4.1 01/10/2021   CL 102 01/10/2021   CO2 30 01/10/2021   BUN 10 01/10/2021   CREATININE 0.85 01/10/2021   PROT 7.1 01/10/2021   ALBUMIN 4.2 01/10/2021   BILITOT 0.7 01/10/2021   ALKPHOS 61 01/10/2021   AST 18 01/10/2021   ALT 14 01/10/2021  .   Total Time in preparing paper work, data evaluation and todays exam - 35 minutes  Huey Bienenstock M.D on 01/10/2021 at 11:37 AM  [facility]   Office  [phone]

## 2021-01-11 LAB — MISC LABCORP TEST (SEND OUT): Labcorp test code: 505310

## 2021-01-11 LAB — NEUROMYELITIS OPTICA AUTOAB, IGG: NMO-IgG: <1.5 U/mL (ref 0.0–3.0)

## 2021-01-12 LAB — PROTEIN ELECTROPHORESIS, SERUM
A/G Ratio: 1.3 (ref 0.7–1.7)
A/G Ratio: 1.3 (ref 0.7–1.7)
Albumin ELP: 3.9 g/dL (ref 2.9–4.4)
Albumin ELP: 4 g/dL (ref 2.9–4.4)
Alpha-1-Globulin: 0.2 g/dL (ref 0.0–0.4)
Alpha-1-Globulin: 0.3 g/dL (ref 0.0–0.4)
Alpha-2-Globulin: 0.9 g/dL (ref 0.4–1.0)
Alpha-2-Globulin: 0.9 g/dL (ref 0.4–1.0)
Beta Globulin: 1.1 g/dL (ref 0.7–1.3)
Beta Globulin: 1.2 g/dL (ref 0.7–1.3)
Gamma Globulin: 0.9 g/dL (ref 0.4–1.8)
Gamma Globulin: 0.9 g/dL (ref 0.4–1.8)
Globulin, Total: 3 g/dL (ref 2.2–3.9)
Globulin, Total: 3.2 g/dL (ref 2.2–3.9)
Total Protein ELP: 6.9 g/dL (ref 6.0–8.5)
Total Protein ELP: 7.2 g/dL (ref 6.0–8.5)

## 2021-01-13 LAB — MYELIN BASIC PROTEIN, CSF: Myelin Basic Protein: 4.4 ng/mL — ABNORMAL HIGH (ref 0.0–3.7)

## 2021-01-14 LAB — OLIGOCLONAL BANDS, CSF + SERM

## 2021-01-19 LAB — IGG CSF INDEX
Albumin CSF-mCnc: 14 mg/dL (ref 8–37)
Albumin: 4.9 g/dL — ABNORMAL HIGH (ref 3.8–4.8)
CSF IgG Index: 0.5 (ref 0.0–0.7)
IgG (Immunoglobin G), Serum: 881 mg/dL (ref 586–1602)
IgG, CSF: 1.2 mg/dL (ref 0.0–6.7)
IgG/Alb Ratio, CSF: 0.09 (ref 0.00–0.25)

## 2021-01-19 LAB — VITAMIN B1: Vitamin B1 (Thiamine): 113.3 nmol/L (ref 66.5–200.0)

## 2021-01-19 LAB — NEUROMYELITIS OPTICA AUTOAB, IGG: NMO-IgG: <1.5 U/mL (ref 0.0–3.0)

## 2021-01-20 ENCOUNTER — Encounter: Payer: Self-pay | Admitting: Neurology

## 2021-01-20 ENCOUNTER — Telehealth: Payer: Self-pay | Admitting: *Deleted

## 2021-01-20 ENCOUNTER — Other Ambulatory Visit: Payer: Self-pay | Admitting: Neurology

## 2021-01-20 ENCOUNTER — Ambulatory Visit: Payer: 59 | Admitting: Neurology

## 2021-01-20 VITALS — BP 102/60 | HR 108 | Ht 66.0 in | Wt 127.0 lb

## 2021-01-20 DIAGNOSIS — G35 Multiple sclerosis: Secondary | ICD-10-CM | POA: Diagnosis not present

## 2021-01-20 DIAGNOSIS — R202 Paresthesia of skin: Secondary | ICD-10-CM | POA: Diagnosis not present

## 2021-01-20 DIAGNOSIS — R5383 Other fatigue: Secondary | ICD-10-CM | POA: Diagnosis not present

## 2021-01-20 MED ORDER — MODAFINIL 100 MG PO TABS: 100.0000 mg | ORAL_TABLET | Freq: Every day | ORAL | 5 refills | Status: DC

## 2021-01-20 MED ORDER — PREDNISONE 10 MG PO TABS: ORAL_TABLET | ORAL | 0 refills | Status: DC

## 2021-01-20 NOTE — Progress Notes (Addendum)
Chief Complaint  Patient presents with  . New Patient (Initial Visit)    ED follow up for neuropathy (decreased sensation in legs and feet). Abnormal CSF. Abnormal MRI scans (brain, cervical, thoracic, lumbar).      ASSESSMENT AND PLAN  Jennifer Bullock is a 50 y.o. female   Relapsing remitting multiple sclerosis Unsteady gait  Diagnosis is based on abnormal MRI of the brain, thoracic spine, positive neurological signs, hyperreflexia, bilateral Babinski signs, extreme fatigue  Spinal fluid testing showed 0 oligoclonal banding, but elevated body basic protein,  We will treat her with IV Solu-Medrol 1000 mg twice consecutive days, followed by prednisone tapering  Laboratory evaluations to rule out treatable etiology,  We also spent lengthy time reviewed MS Society website, ask her to review potential treatment option, including p.o. Tecfidera, Aubagio, Gilenya, and IV infusion of Tysabri, ocrelizumab,  Will return to clinic in 2 months for further discussion of treatment option,  DIAGNOSTIC DATA (LABS, IMAGING, TESTING) - I reviewed patient records, labs, notes, testing and imaging myself where available. JC virus titer was elevated 1.56 in April 2022  MRI lumbar March 2022  1. Normal appearance of the conus and cauda equina. 2. Degenerative changes not causing neural compression, as above.  MRI brain Small scattered insults primarily suspicious for demyelination given spinal cord findings and history.   MRI cervical and thoracic cord. Abnormal cord signal without enhancement at the T6 level. May reflect chronic demyelination.  Lab: Normal B1, CBC, TSH, HIV, MMR, B12, protein electrophoresis, B12,  CSF January 09, 2021, WBC 1, oligoclonal band was 0 in CSF, myelin basic protein elevated 4.4,   HISTORICAL  Jennifer Bullock, is a 50 year old female, seen in request by her primary care physician Dr. Frederic Jericho, Darrick Grinder.  For evaluation of decreased sensation in neck and foot,  abnormal spinal fluid, and MRI scans, initial evaluation was on January 20, 2021   I reviewed and summarized the referring note. PMHX. Anxiety, xanax 85m 1-2 times a day. ADHD, Adderall 213mbid  Since January 2022, she began to noticed upper back pain, she contributed to her overactive at home, but denies gait difficulty at that time,   She began to noticed mild numbness in her feet in February 2022, then the end of March 2022, she noticed significant worsening, the numbness quickly ascending to medial level, she denies pain, mild unsteady gait, overwhelming fatigue,  She denies bowel and bladder incontinence,  She was admitted to the hospital from March 26-27 2022, had extensive evaluations,  MRI of the brain showed small scattered lesions, suggestive of multiple sclerosis No cervical cord involvement, T6 abnormal cord signal, MRI of the lumbar showed no significant abnormality  Laboratory evaluation showed no treatable etiology detailed above, spinal fluid testing showed 0 oligoclonal banding, elevated myelin basic protein, 4.4, IgG index  She is planning on the trip to [place], hope to receive treatment for her extreme fatigue, unbalance  REVIEW OF SYSTEMS: Full 14 system review of systems performed and notable only for as above All other review of systems were negative.  PHYSICAL EXAM   Vitals:   01/20/21 1319  BP: 102/60  Pulse: (!) 108  Weight: 127 lb (57.6 kg)  Height: _0  (1.676 m)   Not recorded     Body mass index is 20.5 kg/m.  PHYSICAL EXAMNIATION:  Gen: NAD, conversant, well nourised, well groomed  Cardiovascular: Regular rate rhythm, no peripheral edema, warm, nontender. Eyes: Conjunctivae clear without exudates or hemorrhage Neck: Supple, no carotid bruits. Pulmonary: Clear to auscultation bilaterally   NEUROLOGICAL EXAM:  MENTAL STATUS: Speech:    Speech is normal; fluent and spontaneous with normal comprehension.   Cognition:     Orientation to time, place and person     Normal recent and remote memory     Normal Attention span and concentration     Normal Language, naming, repeating,spontaneous speech     Fund of knowledge   CRANIAL NERVES: CN II: Visual fields are full to confrontation. Pupils are round equal and briskly reactive to light. CN III, IV, VI: extraocular movement are normal. No ptosis. CN V: Facial sensation is intact to light touch CN VII: Face is symmetric with normal eye closure  CN VIII: Hearing is normal to causal conversation. CN IX, X: Phonation is normal. CN XI: Head turning and shoulder shrug are intact  MOTOR: There is no pronator drift of out-stretched arms. Muscle bulk and tone are normal. Muscle strength is normal.  REFLEXES: Reflexes are 2+ and symmetric at the biceps, triceps, 3/3 knees, and ankles. Plantar responses are extensor bilaterally  SENSORY: Intact to light touch, pinprick and vibratory sensation are intact in fingers and toes.  COORDINATION: There is no trunk or limb dysmetria noted.  GAIT/STANCE: She can get up from seated position, mildly wide-based, cautious  ALLERGIES: Allergies  Allergen Reactions  . Nsaids Nausea And Vomiting    HOME MEDICATIONS: Current Outpatient Medications  Medication Sig Dispense Refill  . acetaminophen (TYLENOL) 325 MG tablet Take 650 mg by mouth every 6 (six) hours as needed for mild pain, fever or headache.    . ALPRAZolam (XANAX) 1 MG tablet Take 1 mg by mouth 2 (two) times daily as needed for anxiety.    Marland Kitchen amphetamine-dextroamphetamine (ADDERALL) 20 MG tablet Take 20 mg by mouth 2 (two) times daily.    Marland Kitchen estradiol (ESTRACE) 1 MG tablet Take 1 mg by mouth daily.    . Multiple Vitamin (MULTIVITAMIN WITH MINERALS) TABS tablet Take 1 tablet by mouth daily.    Marland Kitchen thiamine 100 MG tablet Take 2 tablets (200 mg total) by mouth daily.    Marland Kitchen TRIMETHOPRIM PO Take 1 tablet by mouth as needed (to prevent infection).      No current facility-administered medications for this visit.    PAST MEDICAL HISTORY: Past Medical History:  Diagnosis Date  . ADHD   . Anxiety   . Diverticulitis     PAST SURGICAL HISTORY: Past Surgical History:  Procedure Laterality Date  . ABDOMINAL HYSTERECTOMY    . APPENDECTOMY      FAMILY HISTORY: Family History  Problem Relation Age of Onset  . COPD Mother   . Factor V Leiden deficiency Mother   . Spina bifida Mother   . Seizures Mother   . Brain cancer Father     SOCIAL HISTORY: Social History   Socioeconomic History  . Marital status: Married    Spouse name: Not on file  . Number of children: 2  . Years of education: college  . Highest education level: Not on file  Occupational History  . Occupation: Unemployed at the moment  . Occupation: Previously CMA  Tobacco Use  . Smoking status: Former Research scientist (life sciences)  . Smokeless tobacco: Never Used  Substance and Sexual Activity  . Alcohol use: Yes    Comment: social  . Drug use: Never  . Sexual activity: Not  on file  Other Topics Concern  . Not on file  Social History Narrative   Lives at home with her husband.   Right-handed.   Caffeine use: one cup per day.   Social Determinants of Health   Financial Resource Strain: Not on file  Food Insecurity: Not on file  Transportation Needs: Not on file  Physical Activity: Not on file  Stress: Not on file  Social Connections: Not on file  Intimate Partner Violence: Not on file      Marcial Pacas, M.D. Ph.D.  [facility] [address], [place], [place] [address] Ph: [phone] Fax: [phone]  CC:  Elgergawy, Silver Huguenin, MD [place],  [place] [address]  Dellinger, Estevan Ryder., MD

## 2021-01-20 NOTE — Telephone Encounter (Signed)
PA for modafinil 100mg  started on covermymeds (key: [id number]). Pt has pharmacy coverage through [employer] [phone]). Decision pending.

## 2021-01-20 NOTE — Patient Instructions (Signed)
[email or web address]  Pills. Aubagio (teriflunomide)  BafiertamT (monomethyl fumarate)  Dimethyl Fumarate (dimethyl fumarate - generic equivalent of Tecfidera)  Gilenya (fingolimod)  Tecfidera (dimethyl fumarate)  IV infusion Ocrevus (ocrelizumab)  Tysabri (natalizumab)

## 2021-01-20 NOTE — Telephone Encounter (Signed)
JCV ab labs collected on 01/19/21.  Placed in [facility] box for pick up.

## 2021-01-21 NOTE — Telephone Encounter (Addendum)
Case RV#[id number] approved through 01/20/2022.

## 2021-01-24 LAB — HEMOGLOBIN A1C
Est. average glucose Bld gHb Est-mCnc: 117 mg/dL
Hgb A1c MFr Bld: 5.7 % — ABNORMAL HIGH (ref 4.8–5.6)

## 2021-01-24 LAB — SEDIMENTATION RATE: Sed Rate: 2 mm/hr (ref 0–32)

## 2021-01-24 LAB — VITAMIN D 25 HYDROXY (VIT D DEFICIENCY, FRACTURES): Vit D, 25-Hydroxy: 37.4 ng/mL (ref 30.0–100.0)

## 2021-01-24 LAB — QUANTIFERON-TB GOLD PLUS
QuantiFERON Mitogen Value: >10 IU/mL
QuantiFERON Nil Value: 0.01 IU/mL
QuantiFERON TB1 Ag Value: 0.01 IU/mL
QuantiFERON TB2 Ag Value: 0.04 IU/mL
QuantiFERON-TB Gold Plus: NEGATIVE

## 2021-01-24 LAB — RPR: RPR Ser Ql: NONREACTIVE

## 2021-01-24 LAB — HEPATITIS C ANTIBODY: Hep C Virus Ab: <0.1 s/co ratio (ref 0.0–0.9)

## 2021-01-24 LAB — ANA W/REFLEX: Anti Nuclear Antibody (ANA): NEGATIVE

## 2021-01-24 LAB — HEPATITIS B SURFACE ANTIGEN: Hepatitis B Surface Ag: NEGATIVE

## 2021-01-24 LAB — HEPATITIS B SURFACE ANTIBODY,QUALITATIVE: Hep B Surface Ab, Qual: NONREACTIVE

## 2021-01-24 LAB — C-REACTIVE PROTEIN: CRP: <1 mg/L (ref 0–10)

## 2021-01-24 LAB — CK: Total CK: 38 U/L (ref 32–182)

## 2021-01-24 LAB — VARICELLA ZOSTER ANTIBODY, IGG: Varicella zoster IgG: 517 index (ref >165)

## 2021-01-24 LAB — COPPER, SERUM: Copper: 131 ug/dL (ref 80–158)

## 2021-01-26 LAB — STRATIFY JCV AB (W/ INDEX) W/ RFLX
Index Value: 1.56 — ABNORMAL HIGH
Stratify JCV (TM) Ab w/Reflex Inhibition: POSITIVE — AB

## 2021-01-26 LAB — CLIENT EDUCATION TRACKING

## 2021-01-28 NOTE — Telephone Encounter (Signed)
I called the patient back and provided her with the following website/number as a resource:  nationalMSsociety.org  [phone]  She and her husband will reach out to them.  Dr. Terrace Arabia also signed a handicap placard (for now a temporary placard for six months). The patient would like to pick it up from our office. She is aware of our office hours and plans to come next Monday.  Also, she would like for her appt with Dr. Terrace Arabia to be moved to an earlier date. She has been rescheduled to 02/17/21. I encouraged her to have her husband attend since she is feeling so overwhelmed. She agreed.

## 2021-02-01 ENCOUNTER — Encounter: Payer: Self-pay | Admitting: *Deleted

## 2021-02-01 ENCOUNTER — Other Ambulatory Visit: Payer: Self-pay | Admitting: *Deleted

## 2021-02-01 MED ORDER — TIZANIDINE HCL 4 MG PO CAPS: 4.0000 mg | ORAL_CAPSULE | Freq: Three times a day (TID) | ORAL | 5 refills | Status: DC

## 2021-02-01 MED ORDER — TIZANIDINE HCL 4 MG PO CAPS: 4.0000 mg | ORAL_CAPSULE | Freq: Three times a day (TID) | ORAL | 5 refills | Status: DC | PRN

## 2021-02-01 MED ORDER — TIZANIDINE HCL 4 MG PO CAPS: 4.0000 mg | ORAL_CAPSULE | Freq: Three times a day (TID) | ORAL | 5 refills | Status: AC | PRN

## 2021-02-01 NOTE — Telephone Encounter (Signed)
I called the patient back to let her know that Valium and Xanax are not the appropriate medications to use for her muscle spasms. I spoke to Dr. Terrace Arabia prior to this return call. Per VO by Dr. Terrace Arabia, okay to provide rx for tizanidine 4mg  capsules, 1 cap TID PRN. The patient is agreeable to this plan.

## 2021-02-10 ENCOUNTER — Ambulatory Visit: Payer: Managed Care, Other (non HMO) | Admitting: Neurology

## 2021-02-17 ENCOUNTER — Encounter: Payer: Self-pay | Admitting: Neurology

## 2021-02-17 ENCOUNTER — Other Ambulatory Visit: Payer: Self-pay

## 2021-02-17 ENCOUNTER — Ambulatory Visit (INDEPENDENT_AMBULATORY_CARE_PROVIDER_SITE_OTHER): Payer: 59 | Admitting: Neurology

## 2021-02-17 VITALS — BP 100/66 | HR 94 | Ht 66.0 in | Wt 129.0 lb

## 2021-02-17 DIAGNOSIS — F32A Depression, unspecified: Secondary | ICD-10-CM

## 2021-02-17 DIAGNOSIS — G35 Multiple sclerosis: Secondary | ICD-10-CM | POA: Diagnosis not present

## 2021-02-17 DIAGNOSIS — R5383 Other fatigue: Secondary | ICD-10-CM

## 2021-02-17 DIAGNOSIS — G43709 Chronic migraine without aura, not intractable, without status migrainosus: Secondary | ICD-10-CM | POA: Diagnosis not present

## 2021-02-17 MED ORDER — DULOXETINE HCL 20 MG PO CPEP: 20.0000 mg | ORAL_CAPSULE | Freq: Every day | ORAL | 5 refills | Status: DC

## 2021-02-17 MED ORDER — RIZATRIPTAN BENZOATE 5 MG PO TBDP: 5.0000 mg | ORAL_TABLET | ORAL | 6 refills | Status: AC | PRN

## 2021-02-17 NOTE — Progress Notes (Signed)
Chief Complaint  Patient presents with  . Follow-up    She is here with her husband, Gaspar Bidding, to discuss treatment for MS.      ASSESSMENT AND PLAN  Datra Clary is a 50 y.o. female   Relapsing remitting multiple sclerosis Unsteady gait  Diagnosis is based on abnormal MRI of the brain, thoracic spine, positive neurological signs, hyperreflexia, bilateral Babinski signs, extreme fatigue  Spinal fluid testing showed 0 oligoclonal banding, but elevated myeline basic protein,  We had extensive discussion with patient and her husband about treatment plan, narrow down the option of Tecfidera versus Zaposia, she will go over the option, call back for decision ,  Chronic fatigue  Was started on Provigil 100 mg daily, it was helpful Depression, insomnia  Add on Cymbalta 20 mg daily Urinary urgency Chronic migraine headache with visual aura  Maxalt 5 mg as needed   DIAGNOSTIC DATA (LABS, IMAGING, TESTING) - I reviewed patient records, labs, notes, testing and imaging myself where available. JC virus titer was elevated 1.56 in April 2022  MRI lumbar March 2022  1. Normal appearance of the conus and cauda equina. 2. Degenerative changes not causing neural compression, as above.  MRI brain Small scattered insults primarily suspicious for demyelination given spinal cord findings and history.   MRI cervical and thoracic cord. Abnormal cord signal without enhancement at the T6 level. May reflect chronic demyelination.  Lab: Normal B1, CBC, TSH, HIV, MMR, B12, protein electrophoresis, B12,  CSF January 09, 2021, WBC 1, oligoclonal band was 0 in CSF, myelin basic protein elevated 4.4,   HISTORICAL  Jennifer Bullock, is a 50 year old female, seen in request by her primary care physician Dr. Frederic Jericho, Darrick Grinder.  For evaluation of decreased sensation in neck and foot, abnormal spinal fluid, and MRI scans, initial evaluation was on January 20, 2021   I reviewed and summarized the  referring note. PMHX. Anxiety, xanax 17m 1-2 times a day. ADHD, Adderall 231mbid  Since January 2022, she began to noticed upper back pain, she contributed to her overactive at home, but denies gait difficulty at that time,   She began to noticed mild numbness in her feet in February 2022, then the end of March 2022, she noticed significant worsening, the numbness quickly ascending to medial level, she denies pain, mild unsteady gait, overwhelming fatigue,  She denies bowel and bladder incontinence,  She was admitted to the hospital from March 26-27 2022, had extensive evaluations,  MRI of the brain showed small scattered lesions, suggestive of multiple sclerosis No cervical cord involvement, T6 abnormal cord signal, MRI of the lumbar showed no significant abnormality  Laboratory evaluation showed no treatable etiology detailed above, spinal fluid testing showed 0 oligoclonal banding, elevated myelin basic protein, 4.4, IgG index  She is planning on the trip to [place], hope to receive treatment for her extreme fatigue, unbalance  UPDATE Feb 24 2021: She is accompanied by her husband at today's clinical visit, we have personally reviewed all her imaging studies,  MRI of the brain showed scattered lesions with morphology typical for multiple sclerosis, no cervical cord involvement, T2 abnormal thoracic signal abnormality, MRI of the lumbar spine showed no significant abnormality  Spinal fluid testing showed 0 oligoclonal banding, but there was elevated myelin basic protein, IgG index  Extensive laboratory evaluations showed no treatable etiology  This including normal or negative CBC, CMP, TSH, B12, HIV, NMO antibody, ANA, copper, C-reactive protein, RPR, varicella-zoster, ESR, CPK, hepatitis panel, vitamin D, A1c  was slightly elevated 5.7, JC virus was positive elevated 1.56 in April 2022  Patient is apparently overwhelmed by her diagnosis, complains of worsening depression  anxiety, extreme fatigue, poor stamina, dragging herself, gait abnormality  REVIEW OF SYSTEMS: Full 14 system review of systems performed and notable only for as above All other review of systems were negative.  PHYSICAL EXAM   Vitals:   02/17/21 0728  BP: 100/66  Pulse: 94  Weight: 129 lb (58.5 kg)  Height: 5' 6"  (1.676 m)   Not recorded     Body mass index is 20.82 kg/m.  PHYSICAL EXAMNIATION:  Gen: NAD, conversant, well nourised, well groomed                     Cardiovascular: Regular rate rhythm, no peripheral edema, warm, nontender. Eyes: Conjunctivae clear without exudates or hemorrhage Neck: Supple, no carotid bruits. Pulmonary: Clear to auscultation bilaterally   NEUROLOGICAL EXAM:  MENTAL STATUS: Speech/cognition:    Speech is normal; fluent and spontaneous with normal comprehension.  Tired looking middle-aged female   CRANIAL NERVES: CN II: Visual fields are full to confrontation. Pupils are round equal and briskly reactive to light. CN III, IV, VI: extraocular movement are normal. No ptosis. CN V: Facial sensation is intact to light touch CN VII: Face is symmetric with normal eye closure  CN VIII: Hearing is normal to causal conversation. CN IX, X: Phonation is normal. CN XI: Head turning and shoulder shrug are intact  MOTOR: Mild fixation of right arm upon rapid rotating movement.  REFLEXES: Hyperreflexia of bilateral upper and lower extremity. Plantar responses are extensor bilaterally  SENSORY: Length dependent decreased vibratory sensation, light touch, pinprick at right mid shin level, left lower extremity sensation is well preserved  COORDINATION: There is no trunk or limb dysmetria noted.  GAIT/STANCE: She can get up from seated position, mildly wide-based, cautious, dragging right leg  ALLERGIES: Allergies  Allergen Reactions  . Methocarbamol Other (See Comments)    migraines  . Nsaids Nausea And Vomiting    HOME  MEDICATIONS: Current Outpatient Medications  Medication Sig Dispense Refill  . acetaminophen (TYLENOL) 325 MG tablet Take 650 mg by mouth every 6 (six) hours as needed for mild pain, fever or headache.    . ALPRAZolam (XANAX) 1 MG tablet Take 1 mg by mouth 2 (two) times daily as needed for anxiety.    Marland Kitchen amphetamine-dextroamphetamine (ADDERALL) 20 MG tablet Take 20 mg by mouth 2 (two) times daily.    Marland Kitchen estradiol (ESTRACE) 1 MG tablet Take 1 mg by mouth daily.    . modafinil (PROVIGIL) 100 MG tablet Take 1 tablet (100 mg total) by mouth daily. 30 tablet 5  . Multiple Vitamin (MULTIVITAMIN WITH MINERALS) TABS tablet Take 1 tablet by mouth daily.    Marland Kitchen thiamine 100 MG tablet Take 2 tablets (200 mg total) by mouth daily.    Marland Kitchen tiZANidine (ZANAFLEX) 4 MG capsule Take 1 capsule (4 mg total) by mouth 3 (three) times daily as needed for muscle spasms. 90 capsule 5  . TRIMETHOPRIM PO Take 1 tablet by mouth as needed (to prevent infection).     No current facility-administered medications for this visit.    PAST MEDICAL HISTORY: Past Medical History:  Diagnosis Date  . ADHD   . Anxiety   . Diverticulitis     PAST SURGICAL HISTORY: Past Surgical History:  Procedure Laterality Date  . ABDOMINAL HYSTERECTOMY    . APPENDECTOMY  FAMILY HISTORY: Family History  Problem Relation Age of Onset  . COPD Mother   . Factor V Leiden deficiency Mother   . Spina bifida Mother   . Seizures Mother   . Brain cancer Father     SOCIAL HISTORY: Social History   Socioeconomic History  . Marital status: Married    Spouse name: Not on file  . Number of children: 2  . Years of education: college  . Highest education level: Not on file  Occupational History  . Occupation: Unemployed at the moment  . Occupation: Previously CMA  Tobacco Use  . Smoking status: Former Research scientist (life sciences)  . Smokeless tobacco: Never Used  Substance and Sexual Activity  . Alcohol use: Yes    Comment: social  . Drug use: Never   . Sexual activity: Not on file  Other Topics Concern  . Not on file  Social History Narrative   Lives at home with her husband.   Right-handed.   Caffeine use: one cup per day.   Social Determinants of Health   Financial Resource Strain: Not on file  Food Insecurity: Not on file  Transportation Needs: Not on file  Physical Activity: Not on file  Stress: Not on file  Social Connections: Not on file  Intimate Partner Violence: Not on file      Marcial Pacas, M.D. Ph.D.  [facility] [address], [place], [place] [address] Ph: [phone] Fax: [phone]  CC:  Dellinger, Estevan Ryder., MD [address] [place],  [place] [address]  Dellinger, Estevan Ryder., MD

## 2021-02-17 NOTE — Telephone Encounter (Signed)
Index Value 1.56High   Stratify JCV (TM) Ab w/Reflex Inhibition POSITIVEAbnormal

## 2021-02-17 NOTE — Patient Instructions (Signed)
[email or web address]

## 2021-03-23 ENCOUNTER — Telehealth: Payer: Self-pay

## 2021-03-23 NOTE — Telephone Encounter (Signed)
We have not heard from patient whether she wants to start Tecfidera or Zeposia.  I called patient to discuss.  No answer, left a voicemail asking her to call me back.

## 2021-03-25 ENCOUNTER — Ambulatory Visit: Payer: Managed Care, Other (non HMO) | Admitting: Neurology

## 2021-03-29 NOTE — Telephone Encounter (Signed)
I called patient to discuss.  No answer, left a voicemail asking her to call me back. 

## 2021-04-01 ENCOUNTER — Ambulatory Visit: Payer: Managed Care, Other (non HMO) | Admitting: Neurology

## 2021-04-05 NOTE — Telephone Encounter (Signed)
I called patient again to discuss.  No answer, left a voicemail asking her to call me back.  This is my third unsuccessful attempt at reaching patient via phone.  I will send her a MyChart message.

## 2021-04-08 ENCOUNTER — Ambulatory Visit: Payer: 59 | Admitting: Neurology

## 2021-04-13 NOTE — Telephone Encounter (Signed)
Pt responded via mychart that she is not going to follow up with [facility] and would prefer to start Tecfidera.  Tecfidera start form faxed to [employer]. Received a receipt of confirmation.  PA initiated via CMM.Key: [id number]. Sen to [employer]. It was approved. "CaseId:[id number];Status:Approved;Review Type:Prior Auth;Coverage Start Date:04/13/2021;Coverage End Date:04/13/2022;"

## 2021-04-22 NOTE — Telephone Encounter (Signed)
Jennifer Bullock @ [facility] has called re: a PA needed for starter dose of Tecfidera their ph# [phone] [employer] Ph#[phone] Key Code#[id number]

## 2021-04-26 NOTE — Telephone Encounter (Signed)
PA completed for Tecfidera starter kit via CMM. Key: [id number]. Sent to [employer]. Should have a determination within 1-3 business days.

## 2021-05-05 NOTE — Telephone Encounter (Signed)
Pt called back stating that her insurance only approved the maintenance dose and that they are requiring more information. Pt is requesting a call back

## 2021-05-10 MED ORDER — DIMETHYL FUMARATE 240 MG PO CPDR: 240.0000 mg | DELAYED_RELEASE_CAPSULE | Freq: Two times a day (BID) | ORAL | 1 refills | Status: DC

## 2021-05-10 MED ORDER — DIMETHYL FUMARATE 120 MG PO CPDR: 120.0000 mg | DELAYED_RELEASE_CAPSULE | Freq: Two times a day (BID) | ORAL | 0 refills | Status: DC

## 2021-05-10 NOTE — Addendum Note (Signed)
Addended by: Geronimo Running A on: 05/10/2021 09:43 AM   Modules accepted: Orders

## 2021-05-10 NOTE — Telephone Encounter (Signed)
I called [employer].  This is a 45-minute phone call.  Apparently, the maintenance dose was approved as a generic dimethyl fumarate.  Since the maintenance dose was approved as generic Tecfidera we need to complete a generic Tecfidera PA for the starter pack.  I completed this via phone.  Dimethyl fumarate 120 mg was approved as well as the 240 mg until May 10, 2022.  Case ID: [id number].  I called patient.  I discussed this with her.  She is agreeable to trying dimethyl fumarate.  I explained that she should take 120 mg capsules by mouth twice a day for the first 7 days.  Then if tolerated well, she may take 240 mg by mouth twice a day thereafter.  This will be sent to [facility] specialty pharmacy.  She will let us know if she is unable to tolerate the dimethyl fumarate.  I will check on her next week as well.  Patient verbalized understanding.

## 2021-05-15 ENCOUNTER — Other Ambulatory Visit: Payer: Self-pay | Admitting: Neurology

## 2021-05-17 NOTE — Telephone Encounter (Signed)
I called patient.  She will be receiving her dimethyl fumarate today.  She reports that over the past few weeks she has had nausea, stomach pain, and decreased appetite, for which she is seeing her primary care physician for today.  I reminded her of her appointment with Dr. Terrace Arabia next week.  I will check on her next week to make sure she is tolerating the dimethyl fumarate well.  Patient verbalized understanding.

## 2021-05-25 ENCOUNTER — Ambulatory Visit: Payer: 59 | Admitting: Neurology

## 2021-05-25 ENCOUNTER — Other Ambulatory Visit: Payer: Self-pay

## 2021-05-25 ENCOUNTER — Encounter: Payer: Self-pay | Admitting: Neurology

## 2021-05-25 VITALS — BP 102/69 | HR 89 | Ht 66.0 in | Wt 127.0 lb

## 2021-05-25 DIAGNOSIS — R269 Unspecified abnormalities of gait and mobility: Secondary | ICD-10-CM

## 2021-05-25 DIAGNOSIS — F32A Depression, unspecified: Secondary | ICD-10-CM

## 2021-05-25 DIAGNOSIS — G35 Multiple sclerosis: Secondary | ICD-10-CM | POA: Diagnosis not present

## 2021-05-25 DIAGNOSIS — R5383 Other fatigue: Secondary | ICD-10-CM

## 2021-05-25 DIAGNOSIS — G43709 Chronic migraine without aura, not intractable, without status migrainosus: Secondary | ICD-10-CM | POA: Diagnosis not present

## 2021-05-25 NOTE — Progress Notes (Addendum)
Chief Complaint  Patient presents with   Follow-up    New room w/ husband, Jennifer Bullock. She is now taking dimethyl fumarate 210m BID. She stopped modafinil - states she had heart rate in 120's and it caused mood swings. She never tried the duloxetine 229m Says her PCP gave her a prescription for a higher dose - duloxetine 3068mone cap BID. She has not started it yet either. She plans to ask her PCP to restart generic Adderall 100m79mD for her ADHD. I told her this would also improve her daytime wakefulness.       ASSESSMENT AND PLAN  Jennifer Bullock 50 y20. female   Relapsing remitting multiple sclerosis Unsteady gait  Diagnosis is based on abnormal MRI of the brain, thoracic spine, positive neurological signs, hyperreflexia, bilateral Babinski signs, extreme fatigue  Spinal fluid testing showed 0 oligoclonal banding, but elevated myeline basic protein,  She has started on Dimethyl fumarate 240 mg twice a day, overall tolerating it well  Laboratory evaluations including CBC with differentiation  Chronic fatigue  Tried Provigil 100 mg once a day, reported just does not help her much,  Would like to go back on Adderall 20 mg twice daily, which she has been taking for many years for her ADHD, prescriptions from primary care physician  Depression, insomnia  Will try Cymbalta 20 mg daily    DIAGNOSTIC DATA (LABS, IMAGING, TESTING) - I reviewed patient records, labs, notes, testing and imaging myself where available. JC virus titer was elevated 1.56 in April 2022  MRI lumbar March 2022  1. Normal appearance of the conus and cauda equina. 2. Degenerative changes not causing neural compression, as above.  MRI brain Small scattered insults primarily suspicious for demyelination given spinal cord findings and history.    MRI cervical and thoracic cord. Abnormal cord signal without enhancement at the T6 level. May reflect chronic demyelination.  Lab: Normal B1, CBC, TSH, HIV,  MMR, B12, protein electrophoresis, B12,   CSF January 09, 2021, WBC 1, oligoclonal band was 0 in CSF, myelin basic protein elevated 4.4,   HISTORICAL  Jennifer Bullock a 49 y36r old female, seen in request by her primary care physician Dr. DellFrederic JerichobeDarrick Grinderor evaluation of decreased sensation in neck and foot, abnormal spinal fluid, and MRI scans, initial evaluation was on January 20, 2021   I reviewed and summarized the referring note. PMHX. Anxiety, xanax 1mg 51m times a day. ADHD, Adderall 100mg 56m Since January 2022, she began to noticed upper back pain, she contributed to her overactive at home, but denies gait difficulty at that time,   She began to noticed mild numbness in her feet in February 2022, then the end of March 2022, she noticed significant worsening, the numbness quickly ascending to medial level, she denies pain, mild unsteady gait, overwhelming fatigue,  She denies bowel and bladder incontinence,  She was admitted to the hospital from March 26-27 2022, had extensive evaluations,  MRI of the brain showed small scattered lesions, suggestive of multiple sclerosis No cervical cord involvement, T6 abnormal cord signal, MRI of the lumbar showed no significant abnormality  Laboratory evaluation showed no treatable etiology detailed above, spinal fluid testing showed 0 oligoclonal banding, elevated myelin basic protein, 4.4, IgG index  She is planning on the trip to [place] hope to receive treatment for her extreme fatigue, unbalance  UPDATE Feb 24 2021: She is accompanied by her husband at today's clinical visit, we have personally reviewed all  her imaging studies,  MRI of the brain showed scattered lesions with morphology typical for multiple sclerosis, no cervical cord involvement, T2 abnormal thoracic signal abnormality, MRI of the lumbar spine showed no significant abnormality  Spinal fluid testing showed 0 oligoclonal banding, but there was elevated  myelin basic protein, IgG index  Extensive laboratory evaluations showed no treatable etiology  This including normal or negative CBC, CMP, TSH, B12, HIV, NMO antibody, ANA, copper, C-reactive protein, RPR, varicella-zoster, ESR, CPK, hepatitis panel, vitamin D, A1c was slightly elevated 5.7, JC virus was positive elevated 1.56 in April 2022  Patient is apparently overwhelmed by her diagnosis, complains of worsening depression anxiety, extreme fatigue, poor stamina, dragging herself, gait abnormality   UPDATE August 9th 2022: She is not taking Tecfidera 240 mg twice a day, overall tolerating it well, Continue complains significant fatigue, tried Provigil in the past, but caused moodiness, also made her headache worse,   She has been treated with Adderall 20 mg twice a day since 2017, stopped it in May 2022 when she was taking Provigil, felt Adderall does much better job, would like to go back on it, prescription will be from her primary care physician, she also complains of gait abnormality,   REVIEW OF SYSTEMS: Full 14 system review of systems performed and notable only for as above All other review of systems were negative.  PHYSICAL EXAM   Vitals:   05/25/21 1158  BP: 102/69  Pulse: 89  Weight: 127 lb (57.6 kg)  Height: 5' 6"  (1.676 m)   Not recorded     Body mass index is 20.5 kg/m.  PHYSICAL EXAMNIATION:    Gen: NAD, conversant, well nourised, well groomed                NEUROLOGICAL EXAM:  MENTAL STATUS: Speech/cognition:    Speech is normal; fluent and spontaneous with normal comprehension.  Tired looking middle-aged female   CRANIAL NERVES: CN II: Visual fields are full to confrontation. Pupils are round equal and briskly reactive to light. CN III, IV, VI: extraocular movement are normal. No ptosis. CN V: Facial sensation is intact to light touch CN VII: Face is symmetric with normal eye closure  CN VIII: Hearing is normal to causal conversation. CN IX, X:  Phonation is normal. CN XI: Head turning and shoulder shrug are intact  MOTOR: Mild fixation of right arm upon rapid rotating movement.  REFLEXES: Hyperreflexia of bilateral upper and lower extremity. Plantar responses are extensor bilaterally  SENSORY: Length dependent decreased vibratory sensation, light touch, pinprick at right mid shin level, left lower extremity sensation is well preserved  COORDINATION: There is no trunk or limb dysmetria noted.  GAIT/STANCE: She can get up from seated position, mildly wide-based, cautious, dragging right leg  ALLERGIES: Allergies  Allergen Reactions   Buspar [Buspirone] Other (See Comments)    seizure   Methocarbamol Other (See Comments)    migraines   Modafinil Other (See Comments)    Increased heart rate, mood swings   Nsaids Nausea And Vomiting    HOME MEDICATIONS: Current Outpatient Medications  Medication Sig Dispense Refill   acetaminophen (TYLENOL) 325 MG tablet Take 650 mg by mouth every 6 (six) hours as needed for mild pain, fever or headache.     ALPRAZolam (XANAX) 1 MG tablet Take 1 mg by mouth 2 (two) times daily as needed for anxiety.     amphetamine-dextroamphetamine (ADDERALL) 20 MG tablet Take 20 mg by mouth 2 (two) times daily.  Dimethyl Fumarate 240 MG CPDR Take 1 capsule (240 mg total) by mouth 2 (two) times daily. 180 capsule 1   DULoxetine (CYMBALTA) 30 MG capsule Take 30 mg by mouth 2 (two) times daily.     estradiol (ESTRACE) 1 MG tablet Take 1 mg by mouth daily.     loratadine (CLARITIN) 10 MG tablet Take 10 mg by mouth daily.     melatonin 5 MG TABS Take 5 mg by mouth at bedtime as needed.     rizatriptan (MAXALT-MLT) 5 MG disintegrating tablet Take 1 tablet (5 mg total) by mouth as needed. May repeat in 2 hours if needed 12 tablet 6   tiZANidine (ZANAFLEX) 4 MG capsule Take 1 capsule (4 mg total) by mouth 3 (three) times daily as needed for muscle spasms. 90 capsule 5   TRIMETHOPRIM PO Take 1 tablet by  mouth as needed (to prevent infection).     No current facility-administered medications for this visit.    PAST MEDICAL HISTORY: Past Medical History:  Diagnosis Date   ADHD    Anxiety    Depression    Diverticulitis     PAST SURGICAL HISTORY: Past Surgical History:  Procedure Laterality Date   ABDOMINAL HYSTERECTOMY     APPENDECTOMY      FAMILY HISTORY: Family History  Problem Relation Age of Onset   COPD Mother    Factor V Leiden deficiency Mother    Spina bifida Mother    Seizures Mother    Brain cancer Father     SOCIAL HISTORY: Social History   Socioeconomic History   Marital status: Married    Spouse name: Not on file   Number of children: 2   Years of education: college   Highest education level: Not on file  Occupational History   Occupation: Unemployed at the moment   Occupation: Previously CMA  Tobacco Use   Smoking status: Former   Smokeless tobacco: Never  Substance and Sexual Activity   Alcohol use: Yes    Comment: social   Drug use: Never   Sexual activity: Not on file  Other Topics Concern   Not on file  Social History Narrative   Lives at home with her husband.   Right-handed.   Caffeine use: one cup per day.   Social Determinants of Health   Financial Resource Strain: Not on file  Food Insecurity: Not on file  Transportation Needs: Not on file  Physical Activity: Not on file  Stress: Not on file  Social Connections: Not on file  Intimate Partner Violence: Not on file      Marcial Pacas, M.D. Ph.D.  [facility] [address] [place], [place] [address] Ph: [phone] Fax: [phone]  CC:  Dellinger, Estevan Ryder., MD [place],  [place] [address]  Dellinger, Estevan Ryder., MD   Addendum: Ophthalmology evaluation June 07, 2021: No edema, dry eyes syndrome

## 2021-05-31 NOTE — Telephone Encounter (Signed)
I called patient to discuss how she is feeling on dimethyl fumarate.  No answer, left a voicemail asking her to call me back.

## 2021-06-07 NOTE — Telephone Encounter (Signed)
I called patient again to discuss how she is doing on dimethyl fumarate.  No answer, left a voicemail asking her to call me back.

## 2021-08-27 ENCOUNTER — Other Ambulatory Visit: Payer: Self-pay | Admitting: Neurology

## 2021-10-02 ENCOUNTER — Other Ambulatory Visit: Payer: Self-pay | Admitting: Neurology

## 2021-10-04 NOTE — Telephone Encounter (Signed)
Rx refilled.

## 2021-10-06 ENCOUNTER — Other Ambulatory Visit: Payer: Self-pay | Admitting: Neurology

## 2021-11-25 ENCOUNTER — Ambulatory Visit: Payer: 59 | Admitting: Neurology

## 2021-11-27 ENCOUNTER — Other Ambulatory Visit: Payer: Self-pay | Admitting: Neurology

## 2021-11-30 NOTE — Telephone Encounter (Signed)
Rx refilled.

## 2022-03-10 DIAGNOSIS — Z0271 Encounter for disability determination: Secondary | ICD-10-CM
